# Patient Record
Sex: Female | Born: 1955 | Race: White | Hispanic: No | Marital: Married | Smoking: Never smoker
Health system: Southern US, Community
[De-identification: ages and names within clinical notes are randomized; demographics above are authoritative.]

## PROBLEM LIST (undated history)

## (undated) DIAGNOSIS — M549 Dorsalgia, unspecified: Secondary | ICD-10-CM

## (undated) DIAGNOSIS — Z87442 Personal history of urinary calculi: Secondary | ICD-10-CM

## (undated) DIAGNOSIS — I7300 Raynaud's syndrome without gangrene: Secondary | ICD-10-CM

## (undated) DIAGNOSIS — R51 Headache: Secondary | ICD-10-CM

## (undated) DIAGNOSIS — R34 Anuria and oliguria: Secondary | ICD-10-CM

## (undated) DIAGNOSIS — N319 Neuromuscular dysfunction of bladder, unspecified: Secondary | ICD-10-CM

## (undated) DIAGNOSIS — T4145XA Adverse effect of unspecified anesthetic, initial encounter: Secondary | ICD-10-CM

## (undated) DIAGNOSIS — G8929 Other chronic pain: Secondary | ICD-10-CM

## (undated) DIAGNOSIS — J189 Pneumonia, unspecified organism: Secondary | ICD-10-CM

## (undated) DIAGNOSIS — M542 Cervicalgia: Secondary | ICD-10-CM

## (undated) DIAGNOSIS — K219 Gastro-esophageal reflux disease without esophagitis: Secondary | ICD-10-CM

## (undated) DIAGNOSIS — G43909 Migraine, unspecified, not intractable, without status migrainosus: Secondary | ICD-10-CM

## (undated) DIAGNOSIS — K589 Irritable bowel syndrome without diarrhea: Secondary | ICD-10-CM

## (undated) DIAGNOSIS — T8859XA Other complications of anesthesia, initial encounter: Secondary | ICD-10-CM

## (undated) DIAGNOSIS — M5030 Other cervical disc degeneration, unspecified cervical region: Secondary | ICD-10-CM

## (undated) DIAGNOSIS — N809 Endometriosis, unspecified: Secondary | ICD-10-CM

## (undated) DIAGNOSIS — I10 Essential (primary) hypertension: Secondary | ICD-10-CM

## (undated) HISTORY — PX: HERNIA REPAIR: SHX51

## (undated) HISTORY — DX: Essential (primary) hypertension: I10

## (undated) HISTORY — DX: Endometriosis, unspecified: N80.9

## (undated) HISTORY — PX: TONSILLECTOMY: SUR1361

## (undated) HISTORY — PX: TUBAL LIGATION: SHX77

## (undated) HISTORY — PX: DILATION AND CURETTAGE OF UTERUS: SHX78

## (undated) HISTORY — PX: OTHER SURGICAL HISTORY: SHX169

---

## 2004-09-30 HISTORY — PX: NECK SURGERY: SHX720

## 2004-09-30 HISTORY — PX: BACK SURGERY: SHX140

## 2011-08-27 ENCOUNTER — Other Ambulatory Visit: Payer: Self-pay | Admitting: Neurosurgery

## 2011-08-27 DIAGNOSIS — M542 Cervicalgia: Secondary | ICD-10-CM

## 2011-09-04 ENCOUNTER — Ambulatory Visit
Admission: RE | Admit: 2011-09-04 | Discharge: 2011-09-04 | Disposition: A | Discharge status: Home / Self Care | Payer: Federal, State, Local not specified - PPO | Source: Ambulatory Visit | Attending: Neurosurgery | Admitting: Neurosurgery

## 2011-09-04 ENCOUNTER — Other Ambulatory Visit: Payer: Self-pay | Admitting: Neurosurgery

## 2011-09-04 DIAGNOSIS — M542 Cervicalgia: Secondary | ICD-10-CM

## 2011-09-13 ENCOUNTER — Encounter (HOSPITAL_COMMUNITY): Payer: Self-pay | Admitting: Pharmacy Technician

## 2011-09-19 ENCOUNTER — Encounter (HOSPITAL_COMMUNITY): Payer: Self-pay

## 2011-09-19 ENCOUNTER — Encounter (HOSPITAL_COMMUNITY)
Admission: RE | Admit: 2011-09-19 | Discharge: 2011-09-19 | Disposition: A | Discharge status: Home / Self Care | Payer: Federal, State, Local not specified - PPO | Source: Ambulatory Visit | Attending: Neurosurgery | Admitting: Neurosurgery

## 2011-09-19 HISTORY — DX: Irritable bowel syndrome without diarrhea: K58.9

## 2011-09-19 HISTORY — DX: Gastro-esophageal reflux disease without esophagitis: K21.9

## 2011-09-19 HISTORY — DX: Adverse effect of unspecified anesthetic, initial encounter: T41.45XA

## 2011-09-19 HISTORY — DX: Neuromuscular dysfunction of bladder, unspecified: N31.9

## 2011-09-19 HISTORY — DX: Headache: R51

## 2011-09-19 HISTORY — DX: Pneumonia, unspecified organism: J18.9

## 2011-09-19 HISTORY — DX: Other complications of anesthesia, initial encounter: T88.59XA

## 2011-09-19 HISTORY — DX: Anuria and oliguria: R34

## 2011-09-19 HISTORY — DX: Dorsalgia, unspecified: M54.9

## 2011-09-19 HISTORY — DX: Cervicalgia: M54.2

## 2011-09-19 HISTORY — DX: Other chronic pain: G89.29

## 2011-09-19 HISTORY — DX: Raynaud's syndrome without gangrene: I73.00

## 2011-09-19 LAB — CBC
MCH: 30.8 pg (ref 26.0–34.0)
MCHC: 34 g/dL (ref 30.0–36.0)
Platelets: 264 10*3/uL (ref 150–400)
RBC: 4.52 MIL/uL (ref 3.87–5.11)

## 2011-09-19 LAB — SURGICAL PCR SCREEN
MRSA, PCR: NEGATIVE
Staphylococcus aureus: NEGATIVE

## 2011-09-19 NOTE — Progress Notes (Signed)
Pt needs paper tape

## 2011-09-19 NOTE — Pre-Procedure Instructions (Signed)
20 Claudia Bowen  09/19/2011   Your procedure is scheduled on:  Fri,Dec 28 @ 1200  Report to [facility] at 1000 AM.  Call this number if you have problems the morning of surgery: [phone]   Remember:   Do not eat food:After Midnight.  May have clear liquids: up to 4 Hours before arrival.(until 6am)  Clear liquids include soda, tea, black coffee, apple or grape juice, broth.  Take these medicines the morning of surgery with A SIP OF WATER: Celebrex,Protonix,and Pain Pill(if needed)   Do not wear jewelry, make-up or nail polish.  Do not wear lotions, powders, or perfumes. You may wear deodorant.  Do not shave 48 hours prior to surgery.  Do not bring valuables to the hospital.  Contacts, dentures or bridgework may not be worn into surgery.  Leave suitcase in the car. After surgery it may be brought to your room.  For patients admitted to the hospital, checkout time is 11:00 AM the day of discharge.   Patients discharged the day of surgery will not be allowed to drive home.  Name and phone number of your driver:   Special Instructions: CHG Shower Use Special Wash: 1/2 bottle night before surgery and 1/2 bottle morning of surgery.   Please read over the following fact sheets that you were given: Pain Booklet, Coughing and Deep Breathing, MRSA Information and Surgical Site Infection Prevention

## 2011-09-26 MED ORDER — CEFAZOLIN SODIUM 1-5 GM-% IV SOLN
1.0000 g | INTRAVENOUS | Status: DC
  Filled 2011-09-26: qty 50

## 2011-09-27 ENCOUNTER — Ambulatory Visit (HOSPITAL_COMMUNITY): Payer: Federal, State, Local not specified - PPO

## 2011-09-27 ENCOUNTER — Ambulatory Visit (HOSPITAL_COMMUNITY): Payer: Federal, State, Local not specified - PPO | Admitting: Anesthesiology

## 2011-09-27 ENCOUNTER — Inpatient Hospital Stay (HOSPITAL_COMMUNITY)
Admission: RE | Admit: 2011-09-27 | Discharge: 2011-09-28 | DRG: 864 | Disposition: A | Discharge status: Home / Self Care | Payer: Federal, State, Local not specified - PPO | Source: Ambulatory Visit | Attending: Neurosurgery | Admitting: Neurosurgery

## 2011-09-27 ENCOUNTER — Encounter (HOSPITAL_COMMUNITY): Payer: Self-pay | Admitting: *Deleted

## 2011-09-27 ENCOUNTER — Encounter (HOSPITAL_COMMUNITY)
Admission: RE | Disposition: A | Discharge status: Home / Self Care | Payer: Self-pay | Source: Ambulatory Visit | Attending: Neurosurgery

## 2011-09-27 ENCOUNTER — Encounter (HOSPITAL_COMMUNITY): Payer: Self-pay | Admitting: Anesthesiology

## 2011-09-27 DIAGNOSIS — K219 Gastro-esophageal reflux disease without esophagitis: Secondary | ICD-10-CM | POA: Diagnosis present

## 2011-09-27 DIAGNOSIS — N319 Neuromuscular dysfunction of bladder, unspecified: Secondary | ICD-10-CM | POA: Diagnosis present

## 2011-09-27 DIAGNOSIS — M47812 Spondylosis without myelopathy or radiculopathy, cervical region: Principal | ICD-10-CM | POA: Diagnosis present

## 2011-09-27 HISTORY — PX: ANTERIOR CERVICAL DECOMP/DISCECTOMY FUSION: SHX1161

## 2011-09-27 SURGERY — ANTERIOR CERVICAL DECOMPRESSION/DISCECTOMY FUSION 3 LEVELS
Anesthesia: General | Site: Spine Cervical | Wound class: Clean

## 2011-09-27 MED ORDER — CEFAZOLIN SODIUM 1-5 GM-% IV SOLN
INTRAVENOUS | Status: DC | PRN
  Administered 2011-09-27: 1 g via INTRAVENOUS

## 2011-09-27 MED ORDER — MENTHOL 3 MG MT LOZG: 1.0000 | LOZENGE | OROMUCOSAL | Status: DC | PRN

## 2011-09-27 MED ORDER — MEPERIDINE HCL 25 MG/ML IJ SOLN: 6.2500 mg | INTRAMUSCULAR | Status: DC | PRN

## 2011-09-27 MED ORDER — SODIUM CHLORIDE 0.9 % IJ SOLN
3.0000 mL | Freq: Two times a day (BID) | INTRAMUSCULAR | Status: DC
  Administered 2011-09-27: 3 mL via INTRAVENOUS

## 2011-09-27 MED ORDER — ACETAMINOPHEN 325 MG PO TABS: 650.0000 mg | ORAL_TABLET | ORAL | Status: DC | PRN

## 2011-09-27 MED ORDER — THROMBIN 5000 UNITS EX KIT
PACK | OROMUCOSAL | Status: DC | PRN
  Administered 2011-09-27: 12:00:00 via TOPICAL

## 2011-09-27 MED ORDER — MIDAZOLAM HCL 5 MG/5ML IJ SOLN
INTRAMUSCULAR | Status: DC | PRN
  Administered 2011-09-27: 2 mg via INTRAVENOUS

## 2011-09-27 MED ORDER — HYDROMORPHONE HCL PF 1 MG/ML IJ SOLN
INTRAMUSCULAR | Status: AC
  Administered 2011-09-27: 0.25 mg via INTRAVENOUS
  Filled 2011-09-27: qty 1

## 2011-09-27 MED ORDER — GLYCOPYRROLATE 0.2 MG/ML IJ SOLN
INTRAMUSCULAR | Status: DC | PRN
  Administered 2011-09-27: .4 mg via INTRAVENOUS

## 2011-09-27 MED ORDER — PROMETHAZINE HCL 25 MG/ML IJ SOLN: 6.2500 mg | INTRAMUSCULAR | Status: DC | PRN

## 2011-09-27 MED ORDER — ONDANSETRON HCL 4 MG/2ML IJ SOLN
4.0000 mg | INTRAMUSCULAR | Status: DC | PRN
  Administered 2011-09-27 – 2011-09-28 (×3): 4 mg via INTRAVENOUS
  Filled 2011-09-27 (×3): qty 2

## 2011-09-27 MED ORDER — CELECOXIB 200 MG PO CAPS
200.0000 mg | ORAL_CAPSULE | Freq: Two times a day (BID) | ORAL | Status: DC
  Filled 2011-09-27 (×3): qty 1

## 2011-09-27 MED ORDER — PHENOL 1.4 % MT LIQD
1.0000 | OROMUCOSAL | Status: DC | PRN
  Filled 2011-09-27: qty 177

## 2011-09-27 MED ORDER — BACITRACIN 50000 UNITS IM SOLR
INTRAMUSCULAR | Status: AC
  Filled 2011-09-27: qty 50000

## 2011-09-27 MED ORDER — SUFENTANIL CITRATE 50 MCG/ML IV SOLN
INTRAVENOUS | Status: DC | PRN
  Administered 2011-09-27: 15 ug via INTRAVENOUS
  Administered 2011-09-27 (×2): 10 ug via INTRAVENOUS
  Administered 2011-09-27: 5 ug via INTRAVENOUS
  Administered 2011-09-27: 10 ug via INTRAVENOUS

## 2011-09-27 MED ORDER — PANTOPRAZOLE SODIUM 40 MG IV SOLR: 40.0000 mg | Freq: Every day | INTRAVENOUS | Status: DC

## 2011-09-27 MED ORDER — DEXAMETHASONE SODIUM PHOSPHATE 10 MG/ML IJ SOLN
INTRAMUSCULAR | Status: DC | PRN
  Administered 2011-09-27: 10 mg via INTRAVENOUS

## 2011-09-27 MED ORDER — ROCURONIUM BROMIDE 100 MG/10ML IV SOLN
INTRAVENOUS | Status: DC | PRN
  Administered 2011-09-27: 50 mg via INTRAVENOUS
  Administered 2011-09-27: 10 mg via INTRAVENOUS

## 2011-09-27 MED ORDER — ONDANSETRON HCL 4 MG/2ML IJ SOLN
INTRAMUSCULAR | Status: DC | PRN
  Administered 2011-09-27 (×2): 4 mg via INTRAVENOUS

## 2011-09-27 MED ORDER — HEMOSTATIC AGENTS (NO CHARGE) OPTIME
TOPICAL | Status: DC | PRN
  Administered 2011-09-27: 1 via TOPICAL

## 2011-09-27 MED ORDER — LACTATED RINGERS IV SOLN
INTRAVENOUS | Status: DC | PRN
  Administered 2011-09-27 (×2): via INTRAVENOUS

## 2011-09-27 MED ORDER — HYDROMORPHONE HCL PF 1 MG/ML IJ SOLN
0.2500 mg | INTRAMUSCULAR | Status: DC | PRN
  Administered 2011-09-27 (×2): 0.25 mg via INTRAVENOUS
  Administered 2011-09-27: 0.5 mg via INTRAVENOUS

## 2011-09-27 MED ORDER — HYDROMORPHONE HCL PF 1 MG/ML IJ SOLN
0.5000 mg | INTRAMUSCULAR | Status: DC | PRN
  Administered 2011-09-27: 0.25 mg via INTRAVENOUS
  Administered 2011-09-28 (×3): 0.5 mg via INTRAVENOUS
  Filled 2011-09-27 (×4): qty 1

## 2011-09-27 MED ORDER — SUFENTANIL CITRATE 50 MCG/ML IV SOLN: INTRAVENOUS | Status: DC | PRN

## 2011-09-27 MED ORDER — SODIUM CHLORIDE 0.9 % IV SOLN
INTRAVENOUS | Status: AC
  Filled 2011-09-27: qty 500

## 2011-09-27 MED ORDER — ONDANSETRON HCL 4 MG/2ML IJ SOLN: 4.0000 mg | INTRAMUSCULAR | Status: DC | PRN

## 2011-09-27 MED ORDER — ACETAMINOPHEN 650 MG RE SUPP: 650.0000 mg | RECTAL | Status: DC | PRN

## 2011-09-27 MED ORDER — SUCCINYLCHOLINE CHLORIDE 20 MG/ML IJ SOLN
INTRAMUSCULAR | Status: DC | PRN
  Administered 2011-09-27: 100 mg via INTRAVENOUS

## 2011-09-27 MED ORDER — CYCLOBENZAPRINE HCL 10 MG PO TABS: 10.0000 mg | ORAL_TABLET | Freq: Three times a day (TID) | ORAL | Status: DC | PRN

## 2011-09-27 MED ORDER — HYDROMORPHONE HCL PF 1 MG/ML IJ SOLN: 0.5000 mg | INTRAMUSCULAR | Status: DC | PRN

## 2011-09-27 MED ORDER — PROPOFOL 10 MG/ML IV EMUL
INTRAVENOUS | Status: DC | PRN
  Administered 2011-09-27: 100 mg via INTRAVENOUS

## 2011-09-27 MED ORDER — OXYCODONE-ACETAMINOPHEN 5-325 MG PO TABS: 1.0000 | ORAL_TABLET | ORAL | Status: DC | PRN

## 2011-09-27 MED ORDER — SODIUM CHLORIDE 0.9 % IR SOLN
Status: DC | PRN
  Administered 2011-09-27: 12:00:00

## 2011-09-27 MED ORDER — NEOSTIGMINE METHYLSULFATE 1 MG/ML IJ SOLN
INTRAMUSCULAR | Status: DC | PRN
  Administered 2011-09-27: 3 mg via INTRAVENOUS

## 2011-09-27 MED ORDER — CEFAZOLIN SODIUM 1-5 GM-% IV SOLN
1.0000 g | Freq: Three times a day (TID) | INTRAVENOUS | Status: AC
  Administered 2011-09-27 – 2011-09-28 (×2): 1 g via INTRAVENOUS
  Filled 2011-09-27 (×2): qty 50

## 2011-09-27 MED ORDER — DEXAMETHASONE SODIUM PHOSPHATE 10 MG/ML IJ SOLN
10.0000 mg | Freq: Once | INTRAMUSCULAR | Status: DC
  Filled 2011-09-27 (×2): qty 1

## 2011-09-27 MED ORDER — PHENOL 1.4 % MT LIQD: 1.0000 | OROMUCOSAL | Status: DC | PRN

## 2011-09-27 MED ORDER — 0.9 % SODIUM CHLORIDE (POUR BTL) OPTIME
TOPICAL | Status: DC | PRN
  Administered 2011-09-27: 1000 mL

## 2011-09-27 MED ORDER — SODIUM CHLORIDE 0.9 % IJ SOLN: 3.0000 mL | Freq: Two times a day (BID) | INTRAMUSCULAR | Status: DC

## 2011-09-27 MED ORDER — PHENYLEPHRINE HCL 10 MG/ML IJ SOLN
INTRAMUSCULAR | Status: DC | PRN
  Administered 2011-09-27 (×2): 40 ug via INTRAVENOUS
  Administered 2011-09-27: 80 ug via INTRAVENOUS
  Administered 2011-09-27: 40 ug via INTRAVENOUS
  Administered 2011-09-27: 80 ug via INTRAVENOUS
  Administered 2011-09-27 (×2): 40 ug via INTRAVENOUS
  Administered 2011-09-27: 120 ug via INTRAVENOUS
  Administered 2011-09-27: 40 ug via INTRAVENOUS
  Administered 2011-09-27 (×2): 80 ug via INTRAVENOUS

## 2011-09-27 MED ORDER — THROMBIN 5000 UNITS EX KIT
PACK | CUTANEOUS | Status: DC | PRN
  Administered 2011-09-27: 5000 [IU] via TOPICAL

## 2011-09-27 MED ORDER — PANTOPRAZOLE SODIUM 40 MG PO TBEC: 40.0000 mg | DELAYED_RELEASE_TABLET | Freq: Every day | ORAL | Status: DC

## 2011-09-27 MED ORDER — THROMBIN 20000 UNITS EX KIT
PACK | CUTANEOUS | Status: DC | PRN
  Administered 2011-09-27: 20000 [IU] via TOPICAL

## 2011-09-27 MED ORDER — PANTOPRAZOLE SODIUM 40 MG PO TBEC
40.0000 mg | DELAYED_RELEASE_TABLET | Freq: Every day | ORAL | Status: DC
  Filled 2011-09-27: qty 1

## 2011-09-27 SURGICAL SUPPLY — 64 items
BAG DECANTER FOR FLEXI CONT (MISCELLANEOUS) ×2 IMPLANT
BENZOIN TINCTURE PRP APPL 2/3 (GAUZE/BANDAGES/DRESSINGS) ×2 IMPLANT
BONE CERV LORDOTIC 14.5X12X8 (Bone Implant) ×6 IMPLANT
BONE CERV LORDOTIC 14.5X12X9 (Bone Implant) IMPLANT
BRUSH SCRUB EZ PLAIN DRY (MISCELLANEOUS) ×2 IMPLANT
BUR MATCHSTICK NEURO 3.0 LAGG (BURR) ×2 IMPLANT
CANISTER SUCTION 2500CC (MISCELLANEOUS) ×2 IMPLANT
CLOTH BEACON ORANGE TIMEOUT ST (SAFETY) ×2 IMPLANT
CONT SPEC 4OZ CLIKSEAL STRL BL (MISCELLANEOUS) ×2 IMPLANT
DECANTER SPIKE VIAL GLASS SM (MISCELLANEOUS) ×2 IMPLANT
DERMABOND ADVANCED (GAUZE/BANDAGES/DRESSINGS) ×1
DERMABOND ADVANCED .7 DNX12 (GAUZE/BANDAGES/DRESSINGS) ×1 IMPLANT
DRAIN SNY WOU 7FLT (WOUND CARE) ×2 IMPLANT
DRAPE C-ARM 42X72 X-RAY (DRAPES) ×4 IMPLANT
DRAPE LAPAROTOMY 100X72 PEDS (DRAPES) ×2 IMPLANT
DRAPE MICROSCOPE ZEISS OPMI (DRAPES) ×2 IMPLANT
DRAPE POUCH INSTRU U-SHP 10X18 (DRAPES) ×2 IMPLANT
DRILL BIT (BIT) ×2 IMPLANT
DRSG OPSITE 4X5.5 SM (GAUZE/BANDAGES/DRESSINGS) ×2 IMPLANT
ELECT COATED BLADE 2.86 ST (ELECTRODE) ×2 IMPLANT
ELECT REM PT RETURN 9FT ADLT (ELECTROSURGICAL) ×2
ELECTRODE REM PT RTRN 9FT ADLT (ELECTROSURGICAL) ×1 IMPLANT
EVACUATOR SILICONE 100CC (DRAIN) ×2 IMPLANT
GAUZE SPONGE 4X4 12PLY STRL LF (GAUZE/BANDAGES/DRESSINGS) ×2 IMPLANT
GAUZE SPONGE 4X4 16PLY XRAY LF (GAUZE/BANDAGES/DRESSINGS) ×2 IMPLANT
GLOVE BIO SURGEON STRL SZ8 (GLOVE) ×2 IMPLANT
GLOVE BIOGEL PI IND STRL 7.5 (GLOVE) ×1 IMPLANT
GLOVE BIOGEL PI INDICATOR 7.5 (GLOVE) ×1
GLOVE ECLIPSE 8.5 STRL (GLOVE) ×2 IMPLANT
GLOVE EXAM NITRILE LRG STRL (GLOVE) IMPLANT
GLOVE EXAM NITRILE MD LF STRL (GLOVE) ×4 IMPLANT
GLOVE EXAM NITRILE XL STR (GLOVE) IMPLANT
GLOVE EXAM NITRILE XS STR PU (GLOVE) IMPLANT
GLOVE INDICATOR 8.5 STRL (GLOVE) ×4 IMPLANT
GLOVE SS BIOGEL STRL SZ 7 (GLOVE) ×1 IMPLANT
GLOVE SUPERSENSE BIOGEL SZ 7 (GLOVE) ×1
GOWN BRE IMP SLV AUR LG STRL (GOWN DISPOSABLE) ×2 IMPLANT
GOWN BRE IMP SLV AUR XL STRL (GOWN DISPOSABLE) ×2 IMPLANT
GOWN STRL REIN 2XL LVL4 (GOWN DISPOSABLE) ×6 IMPLANT
HEAD HALTER (SOFTGOODS) ×2 IMPLANT
HEMOSTAT POWDER KIT SURGIFOAM (HEMOSTASIS) ×2 IMPLANT
KIT BASIN OR (CUSTOM PROCEDURE TRAY) ×2 IMPLANT
KIT ROOM TURNOVER OR (KITS) ×2 IMPLANT
NEEDLE SPNL 20GX3.5 QUINCKE YW (NEEDLE) ×2 IMPLANT
NS IRRIG 1000ML POUR BTL (IV SOLUTION) ×2 IMPLANT
PACK LAMINECTOMY NEURO (CUSTOM PROCEDURE TRAY) ×2 IMPLANT
PLATE 1 ATLANTIS TRANS (Plate) ×2 IMPLANT
PLATE 2 40XLCK NS SPNE CVD (Plate) ×1 IMPLANT
PLATE 2 ATLANTIS TRANS (Plate) ×1 IMPLANT
RUBBERBAND STERILE (MISCELLANEOUS) ×4 IMPLANT
SCREW 4.5X13MM (Screw) ×2 IMPLANT
SCREW ST FIX 4 ATL 3120213 (Screw) ×2 IMPLANT
SPONGE GAUZE 4X4 12PLY (GAUZE/BANDAGES/DRESSINGS) ×2 IMPLANT
SPONGE INTESTINAL PEANUT (DISPOSABLE) ×2 IMPLANT
SPONGE SURGIFOAM ABS GEL 100 (HEMOSTASIS) ×2 IMPLANT
STRIP CLOSURE SKIN 1/2X4 (GAUZE/BANDAGES/DRESSINGS) IMPLANT
SUT VIC AB 3-0 SH 8-18 (SUTURE) ×2 IMPLANT
SUT VICRYL 4-0 PS2 18IN ABS (SUTURE) ×2 IMPLANT
SYR 20ML ECCENTRIC (SYRINGE) ×2 IMPLANT
TAPE CLOTH 4X10 WHT NS (GAUZE/BANDAGES/DRESSINGS) IMPLANT
TOWEL OR 17X24 6PK STRL BLUE (TOWEL DISPOSABLE) ×2 IMPLANT
TOWEL OR 17X26 10 PK STRL BLUE (TOWEL DISPOSABLE) ×2 IMPLANT
TRAP SPECIMEN MUCOUS 40CC (MISCELLANEOUS) ×2 IMPLANT
WATER STERILE IRR 1000ML POUR (IV SOLUTION) ×2 IMPLANT

## 2011-09-27 NOTE — Anesthesia Procedure Notes (Signed)
Procedure Name: Intubation Date/Time: 09/27/2011 10:36 AM Performed by: Glendora Score Pre-anesthesia Checklist: Patient identified, Emergency Drugs available, Suction available and Patient being monitored Patient Re-evaluated:Patient Re-evaluated prior to inductionOxygen Delivery Method: Circle System Utilized Preoxygenation: Pre-oxygenation with 100% oxygen Intubation Type: Rapid sequence and Cricoid Pressure applied Tube size: 7.5 mm Number of attempts: 1 Airway Equipment and Method: video-laryngoscopy Placement Confirmation: ETT inserted through vocal cords under direct vision,  positive ETCO2 and breath sounds checked- equal and bilateral Secured at: 21 cm Tube secured with: Tape Dental Injury: Teeth and Oropharynx as per pre-operative assessment

## 2011-09-27 NOTE — Addendum Note (Signed)
Addendum  created 09/27/11 1458 by Melonie Florida, MD   Modules edited:Notes Section

## 2011-09-27 NOTE — Op Note (Signed)
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 Expiration of fusion removal of hardware C5-6  Surgeon: Jillyn Hidden Deunte Bledsoe  Assistant: Barnett Abu   Anesthesia: Gen.  EBL: Minimal  History of present illness:: Patient is a 55 year old female whose had progressive worsening neck and bilateral arm pain rate and 2, her left shoulder and down her left arm Failed all forms of conservative treatment. 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 He failed all forms of conservative treatment imaging findings show progressive spondylosis at C3-4 C4-5 and C6-7 spinal cord compression at all 3 levels solid bony fusion at C5-6.Marland Kitchen Very conservative treatment I mentioned progression symptoms she was recommended anterior cervical discectomies and fusion at C3-4 C4-5 and C6-7 with removal of hardware at C5-6 risks and benefits of the operation worse when the patient as well as perioperative course and expectations of outcome and alternatives of surgery. She understands and agreed to proceed forward.  Operative procedure: Patient brought into the or was induced under general anesthesia and positioned supine the neck in slight extension in 5 pounds of halter traction. The right-sided of her neck prepped and draped in routine sterile fashion. used to fashion C-arm localized the appropriate site for the incision so a curvilinear incision was made just off the midline to the intracortical the sternocleidomastoid. The superficial layer of the platysma was dissected and divided longitudinally. Avascular tenderness ecchymosis or muscle was developed down to the fascia. Fascia just with Kitners. Plate was immediately identified and sequentially removed his fusion does appear  to be solid. The longus close it root there was then reflected laterally exposing the C3-4 C4-5 and C6-7 disc space the large anterior aspect of did not Leksell rongeur all the spaces were incised with 15 blade scalpel aspect of did not first working at C3-4 and C4-5 both the spaces drilled down the posterior annulus suspect complex. At this point the operating mixture was draped and brought into the field under microscopic illumination aggressive and viable template is carried out the PLL was identified and removed in piecemeal fashion arch across laterally to the C4 pedicle pulse he for nerve roots were skeletonized flush with pedicle. There is marked ostectomy of the posterior aspect the C3 vertebral body this is all grossly under been decompress the central canal. After adequate discectomy and achieved adequate decompression both centrally and foraminally this is space was packed with Gelfoam to second C4-5. In a similar fashion C4-5 was drilled down aggressive and viable template is carried out large crossfire both C5 nerve roots were identified to C5 neuroforamina were opened up and sent and the C5 nerve roots, social pedicle. After adequate decompression achieved this level to 6 mm LifeNet wedges were inserted at both levels after adequate preparation of the endplates and copious irrigation with meticulous hemostasis. Then the transposition plate was placed all screws excellent purchase locking mechanisms were engaged and the posterior fluoroscopy confirmed good position of the plate screws and bone graft. This the retractors were repositioned the C6-7 and a similar fashion C6-7 was drilled down this this is a markedly collapsed and large anterior aspect of did not and it is residual posterior annulus suspect the come complex. Large posterior spur coming the C6 vertebral body was aggressively and bitten March across laterally C7 pedicles were identified  and the C7 nerve roots were decompressed especially on  the left skeletonized flush with pedicle the gutter for after adequate foraminotomies been achieved the endplates and scraped in another 6 graft was placed at C6-7. Then a single level plate was placed all screws excellent purchase locking mechanism was engaged and posterior fluoroscopy confirmed good position of plate screws and bone graft the. Surgeon was used to help maintain meticulous hemostasis the surgical site was inspected and after adequate hemostasis achieved a drain was placed and the wounds closed in layers with after Vicryl in the platysma and a running 4 subcuticular benzoin and Steri-Strips applied patient recovered in stable condition. At the end of case undercut sponge counts were correct.

## 2011-09-27 NOTE — Anesthesia Postprocedure Evaluation (Signed)
  Anesthesia Post-op Note  Patient: Claudia Bowen  Procedure(s) Performed:  ANTERIOR CERVICAL DECOMPRESSION/DISCECTOMY FUSION 3 LEVELS - Cervical Three-Four Cervical Four-Five, Cervical Six-Seven Anterior Cervical Decompression Fusion with exploration of fusion Cervical Five-Six- Removal of Hardware (Atlantis Plate) cadaver bone/Atlantis Translational Rm # 33 to follow  Patient Location: PACU  Anesthesia Type: General  Level of Consciousness: awake, alert  and oriented  Airway and Oxygen Therapy: Patient Spontanous Breathing  Post-op Pain: mild  Post-op Assessment: Post-op Vital signs reviewed and Patient's Cardiovascular Status Stable  Post-op Vital Signs: stable  Complications: No apparent anesthesia complications

## 2011-09-27 NOTE — H&P (Signed)
Claudia Bowen is an 55 y.o. female.   Chief Complaint: Neck and left greater right shoulder pain HPI: Patient is a very pleasant 55 year old female who has a history of previous ACDF at C5-6 presents now presents for neck pain bilateral arm pain workup revealed severe cervical stenosis above and below her old fusion at C3445 and 90. Due to patient's failed conservative treatment and progressive clinical syndrome and imaging findings patient was recommended ACDF to C3-4 C4-5 and C6-7 with removal of hardware at C5-6. She denies any difficulty ambulating denies any significant sensory loss in her hands and feet. Of note she is history of neurogenic bladder from spinal anesthesia as a child and  Past Medical History  Diagnosis Date  . Neurogenic bladder   . Complication of anesthesia     pt has a neurogenic bladder and states that she has to go home with a foley catheter  . Pneumonia     at age 52  . Headache     last migraine on 09/17/11  . Chronic back pain     degenerative disc  . Neck pain     buldging disc  . GERD (gastroesophageal reflux disease)     takes Protonix daily  . IBS (irritable bowel syndrome)   . Constipation   . Hemorrhoids   . Oligouria   . Raynaud's disease     Past Surgical History  Procedure Date  . Tonsillectomy     and adenoidectomy as a child  . Hernia repair     at age 6  . Pilondial cyst     at age 67 x 2  . Cesarean section 1987/1992  . Dilation and curettage of uterus     at age 61/24/31  . Renal stents 2005/2007  . Neck surgery 2006  . Back surgery 2006    Family History  Problem Relation Age of Onset  . Anesthesia problems Neg Hx   . Hypotension Neg Hx   . Malignant hyperthermia Neg Hx   . Pseudochol deficiency Neg Hx    Social History:  reports that she has never smoked. She does not have any smokeless tobacco history on file. She reports that she does not drink alcohol or use illicit drugs.  Allergies:  Allergies  Allergen  Reactions  . Sulfa Antibiotics Anaphylaxis and Hives  . Levaquin Hives  . Lortab Itching    Medications Prior to Admission  Medication Dose Route Frequency Provider Last Rate Last Dose  . bacitracin 16109 UNITS injection           . ceFAZolin (ANCEF) IVPB 1 g/50 mL premix  1 g Intravenous 60 min Pre-Op Mariam Dollar      . dexamethasone (DECADRON) injection 10 mg  10 mg Intravenous Once Mariam Dollar      . sodium chloride 0.9 % infusion            No current outpatient prescriptions on file as of 09/27/2011.    No results found for this or any previous visit (from the past 48 hour(s)). No results found.  Review of Systems  Constitutional: Negative.   HENT: Positive for neck pain.   Eyes: Negative.   Respiratory: Negative.   Cardiovascular: Negative.   Gastrointestinal: Negative.   Skin: Negative.   Neurological: Positive for headaches.    Blood pressure 153/88, pulse 91, temperature 97.6 F (36.4 C), temperature source Oral, resp. rate 18, SpO2 100.00%. Physical Exam  Constitutional: She is oriented to person, place,  and time. She appears well-developed.  HENT:  Head: Normocephalic.  Eyes: Pupils are equal, round, and reactive to light.  Respiratory: Breath sounds normal.  GI: Bowel sounds are normal.  Musculoskeletal: Normal range of motion.  Neurological: She is alert and oriented to person, place, and time. She has normal strength. GCS eye subscore is 4. GCS verbal subscore is 5. GCS motor subscore is 6.  Reflex Scores:      Tricep reflexes are 1+ on the right side and 1+ on the left side.      Bicep reflexes are 1+ on the right side and 1+ on the left side.      Brachioradialis reflexes are 1+ on the right side and 1+ on the left side.      Patellar reflexes are 1+ on the right side and 1+ on the left side.      Achilles reflexes are 0 on the right side and 0 on the left side.      Strength is 4+ out of 5 in both triceps otherwise 5 out of 5 in her upper gym is lower  extremity     Assessment/Plan 55 year old female presents for a 3 level ACDF at C3-4 C4-5 and C6-7 with removal of hardware at C5-6. We'll extensively reviewed the risks and benefits of surgery, expectations of surgery, alternatives to surgery, and perioperative course she understands and agrees to proceed forward.  Jahzion Brogden P 09/27/2011, 10:19 AM

## 2011-09-27 NOTE — Anesthesia Preprocedure Evaluation (Signed)
Anesthesia Evaluation  Patient identified by MRN, date of birth, ID band Patient awake    Reviewed: Allergy & Precautions, H&P , NPO status , Patient's Chart, lab work & pertinent test results  Airway Mallampati: III TM Distance: >3 FB Neck ROM: Limited  Mouth opening: Limited Mouth Opening  Dental No notable dental hx. (+) Teeth Intact   Pulmonary neg pulmonary ROS,  clear to auscultation  Pulmonary exam normal       Cardiovascular neg cardio ROS Regular Normal    Neuro/Psych  Headaches, Negative Psych ROS   GI/Hepatic Neg liver ROS, GERD-  Poorly Controlled and Medicated,  Endo/Other  Negative Endocrine ROS  Renal/GU negative Renal ROS  Genitourinary negative   Musculoskeletal   Abdominal   Peds  Hematology negative hematology ROS (+)   Anesthesia Other Findings   Reproductive/Obstetrics negative OB ROS                           Anesthesia Physical Anesthesia Plan  ASA: II  Anesthesia Plan: General, Cricoid Pressure and Rapid Sequence   Post-op Pain Management:    Induction: Intravenous  Airway Management Planned: Oral ETT  Additional Equipment:   Intra-op Plan:   Post-operative Plan: Extubation in OR  Informed Consent: I have reviewed the patients History and Physical, chart, labs and discussed the procedure including the risks, benefits and alternatives for the proposed anesthesia with the patient or authorized representative who has indicated his/her understanding and acceptance.     Plan Discussed with: CRNA  Anesthesia Plan Comments:         Anesthesia Quick Evaluation

## 2011-09-27 NOTE — Transfer of Care (Signed)
Immediate Anesthesia Transfer of Care Note  Patient: Claudia Bowen  Procedure(s) Performed:  ANTERIOR CERVICAL DECOMPRESSION/DISCECTOMY FUSION 3 LEVELS - Cervical Three-Four Cervical Four-Five, Cervical Six-Seven Anterior Cervical Decompression Fusion with exploration of fusion Cervical Five-Six- Removal of Hardware (Atlantis Plate) cadaver bone/Atlantis Translational Rm # 33 to follow  Patient Location: PACU  Anesthesia Type: General  Level of Consciousness: awake, sedated and patient cooperative  Airway & Oxygen Therapy: Patient Spontanous Breathing and Patient connected to face mask oxygen  Post-op Assessment: Report given to PACU RN  Post vital signs: Reviewed and stable  Complications: No apparent anesthesia complications

## 2011-09-27 NOTE — Anesthesia Postprocedure Evaluation (Signed)
  Anesthesia Post-op Note  Patient: Claudia Bowen  Procedure(s) Performed:  ANTERIOR CERVICAL DECOMPRESSION/DISCECTOMY FUSION 3 LEVELS - Cervical Three-Four Cervical Four-Five, Cervical Six-Seven Anterior Cervical Decompression Fusion with exploration of fusion Cervical Five-Six- Removal of Hardware (Atlantis Plate) cadaver bone/Atlantis Translational Rm # 33 to follow  Patient Location: PACU  Anesthesia Type: General  Level of Consciousness: sedated  Airway and Oxygen Therapy: Patient Spontanous Breathing and Patient connected to face mask oxygen  Post-op Pain: none  Post-op Assessment: Post-op Vital signs reviewed, Patient's Cardiovascular Status Stable, Respiratory Function Stable and Patent Airway  Post-op Vital Signs: Reviewed and stable  Complications: No apparent anesthesia complications

## 2011-09-27 NOTE — Preoperative (Signed)
Beta Blockers   Reason not to administer Beta Blockers:Not Applicable 

## 2011-09-28 MED ORDER — CYCLOBENZAPRINE HCL 10 MG PO TABS: 10.0000 mg | ORAL_TABLET | Freq: Three times a day (TID) | ORAL | Status: AC | PRN

## 2011-09-28 MED ORDER — ONDANSETRON HCL 4 MG PO TABS: 4.0000 mg | ORAL_TABLET | Freq: Three times a day (TID) | ORAL | Status: AC | PRN

## 2011-09-28 NOTE — Progress Notes (Signed)
Subjective: Patient reports Patient is doing very well significant room in her preoperative arm symptoms allow swallowing difficulty a lot of pain in the back of her neck and some nausea but otherwise doing fairly well the  Objective: Vital signs in last 24 hours: Temp:  [97.6 F (36.4 C)-98.2 F (36.8 C)] 98.2 F (36.8 C) (12/29 0650) Pulse Rate:  [80-98] 80  (12/29 0650) Resp:  [12-18] 18  (12/29 0650) BP: (110-153)/(71-88) 110/71 mmHg (12/29 0650) SpO2:  [94 %-100 %] 97 % (12/29 0650) Weight:  [55.3 kg (121 lb 14.6 oz)] 121 lb 14.6 oz (55.3 kg) (12/28 1700)  Intake/Output from previous day: 12/28 0701 - 12/29 0700 In: 2607 [I.V.:2503; IV Piggyback:104] Out: 1432 [Urine:1350; Drains:82] Intake/Output this shift:    Strength is baseline for the placenta 5 in her triceps wound is clean and dry  Lab Results: No results found for this basename: WBC:2,HGB:2,HCT:2,PLT:2 in the last 72 hours BMET No results found for this basename: NA:2,K:2,CL:2,CO2:2,GLUCOSE:2,BUN:2,CREATININE:2,CALCIUM:2 in the last 72 hours  Studies/Results: Dg Cervical Spine 1 View  09/27/2011  *RADIOLOGY REPORT*  Clinical Data: C3-C4, C4-C5 and C6-C7 ACDF.  Plate removal at C5- C6.  DG SPINE PORTABLE - 1 VIEW  Comparison: Cervical MRI and radiographs 09/04/2011.  Findings: Single spot lateral fluoroscopic image of the cervical spine demonstrates interval removal of the C5-C6 anterior plate and screws.  There is a new anterior plate and screws extending from C3- C5 with associated intervertebral bone plugs.  There is a new anterior plate and screws at C6-C7 as well.  The new hardware appears well positioned.  The alignment is normal.  Surgical sponges are present anteriorly within the surgical bed.  IMPRESSION: Intraoperative views during revision of cervical fusion, now extending from C3-C7.  No demonstrated complication.  Original Report Authenticated By: Gerrianne Scale, M.D.    Assessment/Plan: 55 year female  status post 3 level into cervical discectomy and fusion doing fairly well be discharged home with her bladder history will discharge her with the catheter and have her followup with urology for removal in the weeks' time.  LOS: 1 day     Kay Ricciuti P 09/28/2011, 8:26 AM

## 2011-09-28 NOTE — Discharge Summary (Signed)
  Physician Discharge Summary  Patient ID: Claudia Bowen MRN: [id number] DOB/AGE: 56-Nov-1957 56 y.o.  Admit date: 09/27/2011 Discharge date: 09/28/2011  Admission Diagnoses: Cervical spondylosis with stenosis at C3-4 4567  Discharge Diagnoses: Same Active Problems:  * No active hospital problems. 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  Consults: Significant Diagnostic Studies: Treatments: Discharge Exam: Blood pressure 110/71, pulse 80, temperature 98.2 F (36.8 C), temperature source Oral, resp. rate 18, height 5\' 4"  (1.626 m), weight 55.3 kg (121 lb 14.6 oz), SpO2 97.00%. Strength is baseline for placenta 5 bilateral triceps when clean dry.her drain  Disposition: Home   Current Discharge Medication List    START taking these medications   Details  cyclobenzaprine (FLEXERIL) 10 MG tablet Take 1 tablet (10 mg total) by mouth 3 (three) times daily as needed for muscle spasms. Qty: 80 tablet, Refills: 1    ondansetron (ZOFRAN) 4 MG tablet Take 1 tablet (4 mg total) by mouth every 8 (eight) hours as needed for nausea. Qty: 60 tablet, Refills: 1      CONTINUE these medications which have NOT CHANGED   Details  celecoxib (CELEBREX) 200 MG capsule Take 200 mg by mouth 2 (two) times daily.      oxyCODONE-acetaminophen (PERCOCET) 5-325 MG per tablet Take 1 tablet by mouth every 4 (four) hours as needed. For pain     pantoprazole (PROTONIX) 40 MG tablet Take 40 mg by mouth daily.         Follow-up Information    Follow up in 1 week.         Signed: Chesnee Floren P 09/28/2011, 8:30 AM

## 2011-10-02 ENCOUNTER — Encounter (HOSPITAL_COMMUNITY): Payer: Self-pay | Admitting: Neurosurgery

## 2011-10-24 ENCOUNTER — Other Ambulatory Visit: Payer: Self-pay | Admitting: Neurosurgery

## 2011-10-24 ENCOUNTER — Ambulatory Visit
Admission: RE | Admit: 2011-10-24 | Discharge: 2011-10-24 | Disposition: A | Discharge status: Home / Self Care | Payer: Federal, State, Local not specified - PPO | Source: Ambulatory Visit | Attending: Neurosurgery | Admitting: Neurosurgery

## 2011-10-24 DIAGNOSIS — M542 Cervicalgia: Secondary | ICD-10-CM

## 2011-11-21 ENCOUNTER — Ambulatory Visit
Admission: RE | Admit: 2011-11-21 | Discharge: 2011-11-21 | Disposition: A | Discharge status: Home / Self Care | Payer: Federal, State, Local not specified - PPO | Source: Ambulatory Visit | Attending: Neurosurgery | Admitting: Neurosurgery

## 2011-11-21 ENCOUNTER — Other Ambulatory Visit: Payer: Self-pay | Admitting: Neurosurgery

## 2011-11-21 DIAGNOSIS — M25512 Pain in left shoulder: Secondary | ICD-10-CM

## 2011-11-21 DIAGNOSIS — M542 Cervicalgia: Secondary | ICD-10-CM

## 2011-11-25 ENCOUNTER — Ambulatory Visit
Admission: RE | Admit: 2011-11-25 | Discharge: 2011-11-25 | Disposition: A | Discharge status: Home / Self Care | Payer: Federal, State, Local not specified - PPO | Source: Ambulatory Visit | Attending: Neurosurgery | Admitting: Neurosurgery

## 2011-11-25 DIAGNOSIS — M25512 Pain in left shoulder: Secondary | ICD-10-CM

## 2013-08-09 ENCOUNTER — Other Ambulatory Visit: Payer: Self-pay | Admitting: Psychiatry

## 2013-08-09 DIAGNOSIS — G43909 Migraine, unspecified, not intractable, without status migrainosus: Secondary | ICD-10-CM

## 2013-08-09 DIAGNOSIS — M4802 Spinal stenosis, cervical region: Secondary | ICD-10-CM

## 2013-08-19 ENCOUNTER — Other Ambulatory Visit: Payer: Federal, State, Local not specified - PPO

## 2013-08-21 ENCOUNTER — Ambulatory Visit
Admission: RE | Admit: 2013-08-21 | Discharge: 2013-08-21 | Disposition: A | Discharge status: Home / Self Care | Payer: Federal, State, Local not specified - PPO | Source: Ambulatory Visit | Attending: Psychiatry | Admitting: Psychiatry

## 2013-08-21 DIAGNOSIS — G43909 Migraine, unspecified, not intractable, without status migrainosus: Secondary | ICD-10-CM

## 2013-08-21 DIAGNOSIS — M4802 Spinal stenosis, cervical region: Secondary | ICD-10-CM

## 2013-09-01 ENCOUNTER — Ambulatory Visit
Admission: RE | Admit: 2013-09-01 | Discharge: 2013-09-01 | Disposition: A | Discharge status: Home / Self Care | Payer: Federal, State, Local not specified - PPO | Source: Ambulatory Visit | Attending: Psychiatry | Admitting: Psychiatry

## 2013-11-04 ENCOUNTER — Other Ambulatory Visit: Payer: Self-pay | Admitting: Neurosurgery

## 2013-11-04 DIAGNOSIS — M545 Low back pain, unspecified: Secondary | ICD-10-CM

## 2013-11-25 ENCOUNTER — Other Ambulatory Visit: Payer: Federal, State, Local not specified - PPO

## 2013-12-03 ENCOUNTER — Other Ambulatory Visit: Payer: Federal, State, Local not specified - PPO

## 2016-02-13 ENCOUNTER — Other Ambulatory Visit: Payer: Self-pay | Admitting: Neurosurgery

## 2016-02-13 DIAGNOSIS — M5126 Other intervertebral disc displacement, lumbar region: Secondary | ICD-10-CM

## 2016-03-06 ENCOUNTER — Ambulatory Visit
Admission: RE | Admit: 2016-03-06 | Discharge: 2016-03-06 | Disposition: A | Discharge status: Home / Self Care | Payer: Federal, State, Local not specified - PPO | Source: Ambulatory Visit | Attending: Neurosurgery | Admitting: Neurosurgery

## 2016-03-06 DIAGNOSIS — M5126 Other intervertebral disc displacement, lumbar region: Secondary | ICD-10-CM

## 2016-03-06 MED ORDER — GADOBENATE DIMEGLUMINE 529 MG/ML IV SOLN
11.0000 mL | Freq: Once | INTRAVENOUS | Status: AC | PRN
  Administered 2016-03-06: 11 mL via INTRAVENOUS

## 2016-09-19 DIAGNOSIS — M5416 Radiculopathy, lumbar region: Secondary | ICD-10-CM | POA: Diagnosis not present

## 2016-09-19 DIAGNOSIS — M5126 Other intervertebral disc displacement, lumbar region: Secondary | ICD-10-CM | POA: Diagnosis not present

## 2016-11-06 DIAGNOSIS — R251 Tremor, unspecified: Secondary | ICD-10-CM | POA: Diagnosis not present

## 2016-11-06 DIAGNOSIS — G43519 Persistent migraine aura without cerebral infarction, intractable, without status migrainosus: Secondary | ICD-10-CM | POA: Diagnosis not present

## 2016-11-06 DIAGNOSIS — M62838 Other muscle spasm: Secondary | ICD-10-CM | POA: Diagnosis not present

## 2016-11-06 DIAGNOSIS — Z6821 Body mass index (BMI) 21.0-21.9, adult: Secondary | ICD-10-CM | POA: Diagnosis not present

## 2016-11-14 DIAGNOSIS — Z1159 Encounter for screening for other viral diseases: Secondary | ICD-10-CM | POA: Diagnosis not present

## 2016-11-14 DIAGNOSIS — K219 Gastro-esophageal reflux disease without esophagitis: Secondary | ICD-10-CM | POA: Diagnosis not present

## 2016-11-14 DIAGNOSIS — Z0000 Encounter for general adult medical examination without abnormal findings: Secondary | ICD-10-CM | POA: Diagnosis not present

## 2016-11-14 DIAGNOSIS — Z6821 Body mass index (BMI) 21.0-21.9, adult: Secondary | ICD-10-CM | POA: Diagnosis not present

## 2016-12-23 DIAGNOSIS — Z124 Encounter for screening for malignant neoplasm of cervix: Secondary | ICD-10-CM | POA: Diagnosis not present

## 2016-12-23 DIAGNOSIS — N941 Unspecified dyspareunia: Secondary | ICD-10-CM | POA: Diagnosis not present

## 2016-12-23 DIAGNOSIS — Z01419 Encounter for gynecological examination (general) (routine) without abnormal findings: Secondary | ICD-10-CM | POA: Diagnosis not present

## 2016-12-23 DIAGNOSIS — Z6821 Body mass index (BMI) 21.0-21.9, adult: Secondary | ICD-10-CM | POA: Diagnosis not present

## 2017-01-15 DIAGNOSIS — Z1231 Encounter for screening mammogram for malignant neoplasm of breast: Secondary | ICD-10-CM | POA: Diagnosis not present

## 2017-01-30 ENCOUNTER — Emergency Department (HOSPITAL_COMMUNITY): Payer: PRIVATE HEALTH INSURANCE

## 2017-01-30 ENCOUNTER — Encounter (HOSPITAL_COMMUNITY): Payer: Self-pay | Admitting: *Deleted

## 2017-01-30 DIAGNOSIS — M79632 Pain in left forearm: Secondary | ICD-10-CM | POA: Diagnosis not present

## 2017-01-30 DIAGNOSIS — S40022A Contusion of left upper arm, initial encounter: Secondary | ICD-10-CM | POA: Diagnosis not present

## 2017-01-30 DIAGNOSIS — Y999 Unspecified external cause status: Secondary | ICD-10-CM | POA: Diagnosis not present

## 2017-01-30 DIAGNOSIS — W1830XA Fall on same level, unspecified, initial encounter: Secondary | ICD-10-CM | POA: Diagnosis not present

## 2017-01-30 DIAGNOSIS — Y939 Activity, unspecified: Secondary | ICD-10-CM | POA: Insufficient documentation

## 2017-01-30 DIAGNOSIS — S4992XA Unspecified injury of left shoulder and upper arm, initial encounter: Secondary | ICD-10-CM | POA: Diagnosis present

## 2017-01-30 DIAGNOSIS — R0789 Other chest pain: Secondary | ICD-10-CM | POA: Diagnosis not present

## 2017-01-30 DIAGNOSIS — R079 Chest pain, unspecified: Secondary | ICD-10-CM | POA: Diagnosis not present

## 2017-01-30 DIAGNOSIS — S5012XA Contusion of left forearm, initial encounter: Secondary | ICD-10-CM | POA: Diagnosis not present

## 2017-01-30 DIAGNOSIS — Z79899 Other long term (current) drug therapy: Secondary | ICD-10-CM | POA: Insufficient documentation

## 2017-01-30 DIAGNOSIS — Y929 Unspecified place or not applicable: Secondary | ICD-10-CM | POA: Diagnosis not present

## 2017-01-30 DIAGNOSIS — S59912A Unspecified injury of left forearm, initial encounter: Secondary | ICD-10-CM | POA: Diagnosis not present

## 2017-01-30 NOTE — ED Triage Notes (Signed)
Pt states yesterday she was helping a patient to the floor and the patient pushed her left arm into the door jam. She assisted him up using her left arm. She felt a soreness in her back and left arm last night. This morning when she woke up she feels a pain in the left chest and it hurts to take a deep breath. She zanaflex 0.5mg  about 1.5 hours ago.

## 2017-01-30 NOTE — ED Triage Notes (Signed)
Pt has tenderness to the left chest with palpation, pain with inspiration, and mild swelling to the forearm. Lungs clear, no deformity

## 2017-01-31 ENCOUNTER — Emergency Department (HOSPITAL_COMMUNITY)
Admission: EM | Admit: 2017-01-31 | Discharge: 2017-01-31 | Disposition: A | Discharge status: Home / Self Care | Payer: PRIVATE HEALTH INSURANCE | Attending: Emergency Medicine | Admitting: Emergency Medicine

## 2017-01-31 DIAGNOSIS — S40022A Contusion of left upper arm, initial encounter: Secondary | ICD-10-CM

## 2017-01-31 DIAGNOSIS — R0789 Other chest pain: Secondary | ICD-10-CM

## 2017-01-31 HISTORY — DX: Migraine, unspecified, not intractable, without status migrainosus: G43.909

## 2017-01-31 NOTE — ED Provider Notes (Signed)
[facility] DEPT Provider Note   CSN: [id number] Arrival date & time: 01/30/17  2310     History   Chief Complaint Chief Complaint  Patient presents with  . Arm Pain    HPI Claudia Bowen is a 61 y.o. female.  Patient is a 61 year old female with history of degenerative disc disease. She presents today for evaluation of left arm pain and left sided chest wall pain. This started yesterday. She is a nurse here at the hospital. She tells me she was ambulating a patient yesterday who became dizzy and fell into her and tender into a doorway. She has swelling to the left mid forearm and is now having pain to her left chest wall. She denies any difficulty breathing. She denies any nausea or diaphoresis. She denies any leg swelling or calf pain. She has taken tizanidine with some relief.      Past Medical History:  Diagnosis Date  . Chronic back pain    degenerative disc  . Complication of anesthesia    pt has a neurogenic bladder and states that she has to go home with a foley catheter  . Constipation   . GERD (gastroesophageal reflux disease)    takes Protonix daily  . Headache(784.0)    last migraine on 09/17/11  . Hemorrhoids   . IBS (irritable bowel syndrome)   . Migraine   . Neck pain    buldging disc  . Neurogenic bladder   . Oligouria   . Pneumonia    at age 4  . Raynaud's disease     There are no active problems to display for this patient.   Past Surgical History:  Procedure Laterality Date  . ANTERIOR CERVICAL DECOMP/DISCECTOMY FUSION  09/27/2011   Procedure: ANTERIOR CERVICAL DECOMPRESSION/DISCECTOMY FUSION 3 LEVELS;  Surgeon: Mariam Dollar;  Location: [facility];  Service: Neurosurgery;  Laterality: N/A;  Cervical Three-Four Cervical Four-Five, Cervical Six-Seven Anterior Cervical Decompression Fusion with exploration of fusion Cervical Five-Six- Removal of Hardware (Atlantis Plate) cadaver bone/Atlantis Translational Rm # 33 to follow  . BACK  SURGERY  2006  . CESAREAN SECTION  1987/1992  . DILATION AND CURETTAGE OF UTERUS     at age 69/24/31  . HERNIA REPAIR     at age 57  . NECK SURGERY  2006  . pilondial cyst     at age 7 x 2  . renal stents  2005/2007  . TONSILLECTOMY     and adenoidectomy as a child    OB History    No data available       Home Medications    Prior to Admission medications   Medication Sig Start Date End Date Taking? Authorizing Provider  celecoxib (CELEBREX) 200 MG capsule Take 200 mg by mouth 2 (two) times daily.      Historical Provider, MD  oxyCODONE-acetaminophen (PERCOCET) 5-325 MG per tablet Take 1 tablet by mouth every 4 (four) hours as needed. For pain     Historical Provider, MD  pantoprazole (PROTONIX) 40 MG tablet Take 40 mg by mouth daily.      Historical Provider, MD    Family History Family History  Problem Relation Age of Onset  . Anesthesia problems Neg Hx   . Hypotension Neg Hx   . Malignant hyperthermia Neg Hx   . Pseudochol deficiency Neg Hx     Social History Social History  Substance Use Topics  . Smoking status: Never Smoker  . Smokeless tobacco: Never Used  .  Alcohol use No     Allergies   Sulfa antibiotics; Hydrocodone-acetaminophen; and Levofloxacin   Review of Systems Review of Systems  All other systems reviewed and are negative.    Physical Exam Updated Vital Signs BP (!) 147/90 (BP Location: Right Arm)   Pulse 88   Temp 97.6 F (36.4 C) (Oral)   Resp 16   Ht 5\' 4"  (1.626 m)   Wt 128 lb (58.1 kg)   SpO2 98%   BMI 21.97 kg/m   Physical Exam  Constitutional: She is oriented to person, place, and time. She appears well-developed and well-nourished. No distress.  HENT:  Head: Normocephalic and atraumatic.  Neck: Normal range of motion. Neck supple.  Cardiovascular: Normal rate and regular rhythm.  Exam reveals no gallop and no friction rub.   No murmur heard. Pulmonary/Chest: Effort normal and breath sounds normal. No respiratory  distress. She has no wheezes. She exhibits tenderness.  There is tenderness to palpation of the left lateral chest wall. There is no crepitus or palpable abnormality.  Abdominal: Soft. Bowel sounds are normal. She exhibits no distension. There is no tenderness.  Musculoskeletal: Normal range of motion.  There is mild swelling and ecchymosis to the mid left lateral forearm. There is no obvious deformity otherwise. Distal PMS is intact and she has full range of motion of the elbow and wrist.  Neurological: She is alert and oriented to person, place, and time.  Skin: Skin is warm and dry. She is not diaphoretic.  Nursing note and vitals reviewed.    ED Treatments / Results  Labs (all labs ordered are listed, but only abnormal results are displayed) Labs Reviewed - No data to display  EKG  EKG Interpretation  Date/Time:  Thursday Jan 30 2017 23:27:46 EDT Ventricular Rate:  88 PR Interval:  142 QRS Duration: 70 QT Interval:  350 QTC Calculation: 423 R Axis:   69 Text Interpretation:  Normal sinus rhythm Normal ECG Confirmed by Ashleigh Arya  MD, Kelty Szafran ([id number]) on 01/31/2017 1:10:35 AM       Radiology Dg Chest 2 View  Result Date: 01/31/2017 CLINICAL DATA:  Left-sided chest pain with dyspnea and palpitations, onset tonight. EXAM: CHEST  2 VIEW COMPARISON:  None. FINDINGS: The lungs are clear. The pulmonary vasculature is normal. Heart size is normal. Hilar and mediastinal contours are unremarkable. There is no pleural effusion. IMPRESSION: No active cardiopulmonary disease. Electronically Signed   By: Ellery Plunkaniel R Mitchell M.D.   On: 01/31/2017 00:09   Dg Forearm Left  Result Date: 01/31/2017 CLINICAL DATA:  Hit forearm against a door, pain EXAM: LEFT FOREARM - 2 VIEW COMPARISON:  None. FINDINGS: There is no evidence of fracture or other focal bone lesions. Soft tissues are unremarkable. IMPRESSION: Negative. Electronically Signed   By: Jasmine PangKim  Fujinaga M.D.   On: 01/31/2017 00:10     Procedures Procedures (including critical care time)  Medications Ordered in ED Medications - No data to display   Initial Impression / Assessment and Plan / ED Course  I have reviewed the triage vital signs and the nursing notes.  Pertinent labs & imaging results that were available during my care of the patient were reviewed by me and considered in my medical decision making (see chart for details).  Patient with a forearm contusion and negative x-rays. She is also having pain in her left lateral chest wall that is very reproducible with a normal EKG and negative chest x-ray. There is no hypoxia and no tachycardia, and I  highly doubt the possibility of PE.  She will be discharged with an arm sling and continued muscle relaxers. To return as needed for any problems.  Final Clinical Impressions(s) / ED Diagnoses   Final diagnoses:  None    New Prescriptions New Prescriptions   No medications on file     Geoffery Lyons, MD 01/31/17 [phone]

## 2017-01-31 NOTE — Discharge Instructions (Signed)
Continue taking your tizanidine as previously prescribed.  Wear arm sling as applied in the Emergency Department as needed for comfort.

## 2017-04-01 DIAGNOSIS — Z6822 Body mass index (BMI) 22.0-22.9, adult: Secondary | ICD-10-CM | POA: Diagnosis not present

## 2017-04-01 DIAGNOSIS — R251 Tremor, unspecified: Secondary | ICD-10-CM | POA: Diagnosis not present

## 2017-04-01 DIAGNOSIS — M542 Cervicalgia: Secondary | ICD-10-CM | POA: Diagnosis not present

## 2017-04-01 DIAGNOSIS — G43519 Persistent migraine aura without cerebral infarction, intractable, without status migrainosus: Secondary | ICD-10-CM | POA: Diagnosis not present

## 2017-05-13 DIAGNOSIS — Z6822 Body mass index (BMI) 22.0-22.9, adult: Secondary | ICD-10-CM | POA: Diagnosis not present

## 2017-05-13 DIAGNOSIS — M5416 Radiculopathy, lumbar region: Secondary | ICD-10-CM | POA: Diagnosis not present

## 2017-05-13 DIAGNOSIS — M5441 Lumbago with sciatica, right side: Secondary | ICD-10-CM | POA: Diagnosis not present

## 2017-06-30 DIAGNOSIS — R030 Elevated blood-pressure reading, without diagnosis of hypertension: Secondary | ICD-10-CM | POA: Diagnosis not present

## 2017-06-30 DIAGNOSIS — M5416 Radiculopathy, lumbar region: Secondary | ICD-10-CM | POA: Diagnosis not present

## 2017-07-08 ENCOUNTER — Other Ambulatory Visit: Payer: Self-pay | Admitting: Student

## 2017-07-08 DIAGNOSIS — M5416 Radiculopathy, lumbar region: Secondary | ICD-10-CM

## 2017-07-19 ENCOUNTER — Other Ambulatory Visit: Payer: Self-pay

## 2017-07-20 ENCOUNTER — Ambulatory Visit
Admission: RE | Admit: 2017-07-20 | Discharge: 2017-07-20 | Disposition: A | Discharge status: Home / Self Care | Payer: Federal, State, Local not specified - PPO | Source: Ambulatory Visit | Attending: Student | Admitting: Student

## 2017-07-20 DIAGNOSIS — M4807 Spinal stenosis, lumbosacral region: Secondary | ICD-10-CM | POA: Diagnosis not present

## 2017-07-20 DIAGNOSIS — M5416 Radiculopathy, lumbar region: Secondary | ICD-10-CM

## 2017-07-20 MED ORDER — GADOBENATE DIMEGLUMINE 529 MG/ML IV SOLN
10.0000 mL | Freq: Once | INTRAVENOUS | Status: AC | PRN
  Administered 2017-07-20: 10 mL via INTRAVENOUS

## 2017-07-21 DIAGNOSIS — N281 Cyst of kidney, acquired: Secondary | ICD-10-CM | POA: Diagnosis not present

## 2017-07-21 DIAGNOSIS — R030 Elevated blood-pressure reading, without diagnosis of hypertension: Secondary | ICD-10-CM | POA: Diagnosis not present

## 2017-07-21 DIAGNOSIS — M4807 Spinal stenosis, lumbosacral region: Secondary | ICD-10-CM | POA: Diagnosis not present

## 2017-07-21 DIAGNOSIS — M5416 Radiculopathy, lumbar region: Secondary | ICD-10-CM | POA: Diagnosis not present

## 2017-07-22 ENCOUNTER — Other Ambulatory Visit: Payer: Self-pay | Admitting: Neurosurgery

## 2017-07-22 DIAGNOSIS — N281 Cyst of kidney, acquired: Secondary | ICD-10-CM

## 2017-07-31 DIAGNOSIS — M48061 Spinal stenosis, lumbar region without neurogenic claudication: Secondary | ICD-10-CM | POA: Diagnosis not present

## 2017-08-04 DIAGNOSIS — M48061 Spinal stenosis, lumbar region without neurogenic claudication: Secondary | ICD-10-CM | POA: Diagnosis not present

## 2017-08-07 DIAGNOSIS — M6281 Muscle weakness (generalized): Secondary | ICD-10-CM | POA: Diagnosis not present

## 2017-08-07 DIAGNOSIS — M48061 Spinal stenosis, lumbar region without neurogenic claudication: Secondary | ICD-10-CM | POA: Diagnosis not present

## 2017-08-11 DIAGNOSIS — M48061 Spinal stenosis, lumbar region without neurogenic claudication: Secondary | ICD-10-CM | POA: Diagnosis not present

## 2017-08-11 DIAGNOSIS — M6281 Muscle weakness (generalized): Secondary | ICD-10-CM | POA: Diagnosis not present

## 2017-08-22 DIAGNOSIS — M48061 Spinal stenosis, lumbar region without neurogenic claudication: Secondary | ICD-10-CM | POA: Diagnosis not present

## 2017-08-22 DIAGNOSIS — M6281 Muscle weakness (generalized): Secondary | ICD-10-CM | POA: Diagnosis not present

## 2017-08-26 ENCOUNTER — Other Ambulatory Visit: Payer: Self-pay | Admitting: Neurosurgery

## 2017-08-26 DIAGNOSIS — N281 Cyst of kidney, acquired: Secondary | ICD-10-CM | POA: Diagnosis not present

## 2017-08-26 DIAGNOSIS — M4807 Spinal stenosis, lumbosacral region: Secondary | ICD-10-CM | POA: Diagnosis not present

## 2017-08-26 DIAGNOSIS — R030 Elevated blood-pressure reading, without diagnosis of hypertension: Secondary | ICD-10-CM | POA: Diagnosis not present

## 2017-08-28 ENCOUNTER — Other Ambulatory Visit: Payer: Self-pay | Admitting: Neurosurgery

## 2017-08-28 DIAGNOSIS — N281 Cyst of kidney, acquired: Secondary | ICD-10-CM

## 2017-08-29 ENCOUNTER — Ambulatory Visit
Admission: RE | Admit: 2017-08-29 | Discharge: 2017-08-29 | Disposition: A | Discharge status: Home / Self Care | Payer: Federal, State, Local not specified - PPO | Source: Ambulatory Visit | Attending: Neurosurgery | Admitting: Neurosurgery

## 2017-08-29 DIAGNOSIS — K7689 Other specified diseases of liver: Secondary | ICD-10-CM | POA: Diagnosis not present

## 2017-08-29 DIAGNOSIS — N281 Cyst of kidney, acquired: Secondary | ICD-10-CM

## 2017-08-29 MED ORDER — IOPAMIDOL (ISOVUE-300) INJECTION 61%
100.0000 mL | Freq: Once | INTRAVENOUS | Status: AC | PRN
  Administered 2017-08-29: 100 mL via INTRAVENOUS

## 2017-09-17 NOTE — Pre-Procedure Instructions (Signed)
Claudia Bowen  09/17/2017      [id number] - [place], [place] - [name] [facility] [id number] Phone: [phone] Fax: [phone]    Your procedure is scheduled on September 29, 2017.  [facility] Admitting at 530 AM.  Call this number if you have problems the morning of surgery:  [phone]   Remember:  Do not eat food or drink liquids after midnight.  Take these medicines the morning of surgery with A SIP OF WATER divalproex (depakote), pantoprazole (protonix), odansetron (zofran)-if needed for nausea.  7 days prior to surgery STOP taking any Aspirin (unless otherwise instructed by your surgeon), Aleve, Naproxen, Ibuprofen, Motrin, Advil, Goody's, BC's, all herbal medications, fish oil, and all vitamins  Continue all other medications as instructed by your physician except follow the above medication instructions before surgery   Do not wear jewelry, make-up or nail polish.  Do not wear lotions, powders, or perfumes, or deodorant.  Do not shave 48 hours prior to surgery.    Do not bring valuables to the hospital.  [facility] is not responsible for any belongings or valuables.  Contacts, dentures or bridgework may not be worn into surgery.  Leave your suitcase in the car.  After surgery it may be brought to your room.  For patients admitted to the hospital, discharge time will be determined by your treatment team.  Patients discharged the day of surgery will not be allowed to drive home.   Special instructions:   [facility]- Preparing For Surgery  Before surgery, you can play an important role. Because skin is not sterile, your skin needs to be as free of germs as possible. You can reduce the number of germs on your skin by washing with CHG (chlorahexidine gluconate) Soap before surgery.  CHG is an antiseptic cleaner which kills germs and bonds with the skin to continue killing germs even after  washing.  Please do not use if you have an allergy to CHG or antibacterial soaps. If your skin becomes reddened/irritated stop using the CHG.  Do not shave (including legs and underarms) for at least 48 hours prior to first CHG shower. It is OK to shave your face.  Please follow these instructions carefully.   1. Shower the NIGHT BEFORE SURGERY and the MORNING OF SURGERY with CHG.   2. If you chose to wash your hair, wash your hair first as usual with your normal shampoo.  3. After you shampoo, rinse your hair and body thoroughly to remove the shampoo.  4. Use CHG as you would any other liquid soap. You can apply CHG directly to the skin and wash gently with a scrungie or a clean washcloth.   5. Apply the CHG Soap to your body ONLY FROM THE NECK DOWN.  Do not use on open wounds or open sores. Avoid contact with your eyes, ears, mouth and genitals (private parts). Wash Face and genitals (private parts)  with your normal soap.  6. Wash thoroughly, paying special attention to the area where your surgery will be performed.  7. Thoroughly rinse your body with warm water from the neck down.  8. DO NOT shower/wash with your normal soap after using and rinsing off the CHG Soap.  9. Pat yourself dry with a CLEAN TOWEL.  10. Wear CLEAN PAJAMAS to bed the night before surgery, wear comfortable clothes the morning of surgery  11. Place CLEAN SHEETS on your bed the night  of your first shower and DO NOT SLEEP WITH PETS.    Day of Surgery: Do not apply any deodorants/lotions. Please wear clean clothes to the hospital/surgery center.     Please read over the following fact sheets that you were given. Pain Booklet, Coughing and Deep Breathing, MRSA Information and Surgical Site Infection Prevention

## 2017-09-18 ENCOUNTER — Encounter (HOSPITAL_COMMUNITY): Payer: Self-pay

## 2017-09-18 ENCOUNTER — Other Ambulatory Visit: Payer: Self-pay

## 2017-09-18 ENCOUNTER — Encounter (HOSPITAL_COMMUNITY)
Admission: RE | Admit: 2017-09-18 | Discharge: 2017-09-18 | Disposition: A | Discharge status: Home / Self Care | Payer: Federal, State, Local not specified - PPO | Source: Ambulatory Visit | Attending: Neurosurgery | Admitting: Neurosurgery

## 2017-09-18 DIAGNOSIS — Z01812 Encounter for preprocedural laboratory examination: Secondary | ICD-10-CM | POA: Diagnosis not present

## 2017-09-18 HISTORY — DX: Personal history of urinary calculi: Z87.442

## 2017-09-18 HISTORY — DX: Other cervical disc degeneration, unspecified cervical region: M50.30

## 2017-09-18 LAB — TYPE AND SCREEN
ABO/RH(D): B POS
Antibody Screen: NEGATIVE

## 2017-09-18 LAB — CBC
HEMATOCRIT: 42.3 % (ref 36.0–46.0)
HEMOGLOBIN: 14.2 g/dL (ref 12.0–15.0)
MCH: 30.1 pg (ref 26.0–34.0)
MCHC: 33.6 g/dL (ref 30.0–36.0)
MCV: 89.6 fL (ref 78.0–100.0)
Platelets: 237 10*3/uL (ref 150–400)
RBC: 4.72 MIL/uL (ref 3.87–5.11)
RDW: 13.1 % (ref 11.5–15.5)
WBC: 3.9 10*3/uL — AB (ref 4.0–10.5)

## 2017-09-18 LAB — BASIC METABOLIC PANEL
Anion gap: 7 (ref 5–15)
BUN: 17 mg/dL (ref 6–20)
CO2: 29 mmol/L (ref 22–32)
Calcium: 9.6 mg/dL (ref 8.9–10.3)
Chloride: 103 mmol/L (ref 101–111)
Creatinine, Ser: 0.66 mg/dL (ref 0.44–1.00)
GFR calc Af Amer: >60 mL/min (ref >60)
GLUCOSE: 107 mg/dL — AB (ref 65–99)
POTASSIUM: 4.3 mmol/L (ref 3.5–5.1)
Sodium: 139 mmol/L (ref 135–145)

## 2017-09-18 LAB — SURGICAL PCR SCREEN
MRSA, PCR: NEGATIVE
STAPHYLOCOCCUS AUREUS: POSITIVE — AB

## 2017-09-18 LAB — ABO/RH: ABO/RH(D): B POS

## 2017-09-18 NOTE — Progress Notes (Signed)
Denies any cardiac issues.  No murmurs, no cardiac tests done. PCP is Dr. Haze RushingYbanez  LOV 04/2017

## 2017-09-29 ENCOUNTER — Inpatient Hospital Stay (HOSPITAL_COMMUNITY): Payer: Federal, State, Local not specified - PPO

## 2017-09-29 ENCOUNTER — Inpatient Hospital Stay (HOSPITAL_COMMUNITY): Payer: Federal, State, Local not specified - PPO | Admitting: Anesthesiology

## 2017-09-29 ENCOUNTER — Encounter (HOSPITAL_COMMUNITY)
Admission: RE | Disposition: A | Discharge status: Home / Self Care | Payer: Self-pay | Source: Ambulatory Visit | Attending: Neurosurgery

## 2017-09-29 ENCOUNTER — Inpatient Hospital Stay (HOSPITAL_COMMUNITY)
Admission: RE | Admit: 2017-09-29 | Discharge: 2017-10-01 | DRG: 455 | Disposition: A | Discharge status: Home / Self Care | Payer: Federal, State, Local not specified - PPO | Source: Ambulatory Visit | Attending: Neurosurgery | Admitting: Neurosurgery

## 2017-09-29 ENCOUNTER — Encounter (HOSPITAL_COMMUNITY): Payer: Self-pay | Admitting: *Deleted

## 2017-09-29 DIAGNOSIS — Z79899 Other long term (current) drug therapy: Secondary | ICD-10-CM

## 2017-09-29 DIAGNOSIS — K589 Irritable bowel syndrome without diarrhea: Secondary | ICD-10-CM | POA: Diagnosis present

## 2017-09-29 DIAGNOSIS — K219 Gastro-esophageal reflux disease without esophagitis: Secondary | ICD-10-CM | POA: Diagnosis present

## 2017-09-29 DIAGNOSIS — Z419 Encounter for procedure for purposes other than remedying health state, unspecified: Secondary | ICD-10-CM

## 2017-09-29 DIAGNOSIS — G43909 Migraine, unspecified, not intractable, without status migrainosus: Secondary | ICD-10-CM | POA: Diagnosis present

## 2017-09-29 DIAGNOSIS — Z87442 Personal history of urinary calculi: Secondary | ICD-10-CM | POA: Diagnosis not present

## 2017-09-29 DIAGNOSIS — N319 Neuromuscular dysfunction of bladder, unspecified: Secondary | ICD-10-CM | POA: Diagnosis not present

## 2017-09-29 DIAGNOSIS — M4326 Fusion of spine, lumbar region: Secondary | ICD-10-CM | POA: Diagnosis not present

## 2017-09-29 DIAGNOSIS — Z888 Allergy status to other drugs, medicaments and biological substances status: Secondary | ICD-10-CM

## 2017-09-29 DIAGNOSIS — Z981 Arthrodesis status: Secondary | ICD-10-CM

## 2017-09-29 DIAGNOSIS — M1990 Unspecified osteoarthritis, unspecified site: Secondary | ICD-10-CM | POA: Diagnosis not present

## 2017-09-29 DIAGNOSIS — Z881 Allergy status to other antibiotic agents status: Secondary | ICD-10-CM | POA: Diagnosis not present

## 2017-09-29 DIAGNOSIS — M532X6 Spinal instabilities, lumbar region: Secondary | ICD-10-CM | POA: Diagnosis not present

## 2017-09-29 DIAGNOSIS — Z791 Long term (current) use of non-steroidal anti-inflammatories (NSAID): Secondary | ICD-10-CM | POA: Diagnosis not present

## 2017-09-29 DIAGNOSIS — M5137 Other intervertebral disc degeneration, lumbosacral region: Secondary | ICD-10-CM | POA: Diagnosis present

## 2017-09-29 DIAGNOSIS — M48061 Spinal stenosis, lumbar region without neurogenic claudication: Principal | ICD-10-CM | POA: Diagnosis present

## 2017-09-29 DIAGNOSIS — K5900 Constipation, unspecified: Secondary | ICD-10-CM | POA: Diagnosis not present

## 2017-09-29 DIAGNOSIS — M5116 Intervertebral disc disorders with radiculopathy, lumbar region: Secondary | ICD-10-CM | POA: Diagnosis not present

## 2017-09-29 DIAGNOSIS — M5136 Other intervertebral disc degeneration, lumbar region: Secondary | ICD-10-CM | POA: Diagnosis present

## 2017-09-29 DIAGNOSIS — Z882 Allergy status to sulfonamides status: Secondary | ICD-10-CM | POA: Diagnosis not present

## 2017-09-29 DIAGNOSIS — M51379 Other intervertebral disc degeneration, lumbosacral region without mention of lumbar back pain or lower extremity pain: Secondary | ICD-10-CM | POA: Diagnosis present

## 2017-09-29 HISTORY — PX: LUMBAR FUSION: SHX111

## 2017-09-29 SURGERY — POSTERIOR LUMBAR FUSION 2 LEVEL
Anesthesia: General | Site: Back

## 2017-09-29 MED ORDER — DEXAMETHASONE SODIUM PHOSPHATE 10 MG/ML IJ SOLN
10.0000 mg | INTRAMUSCULAR | Status: AC
  Administered 2017-09-29: 10 mg via INTRAVENOUS

## 2017-09-29 MED ORDER — FENTANYL CITRATE (PF) 100 MCG/2ML IJ SOLN
25.0000 ug | INTRAMUSCULAR | Status: DC | PRN
  Administered 2017-09-29 (×2): 50 ug via INTRAVENOUS

## 2017-09-29 MED ORDER — DOCUSATE SODIUM 100 MG PO CAPS: 100.0000 mg | ORAL_CAPSULE | Freq: Every day | ORAL | Status: DC | PRN

## 2017-09-29 MED ORDER — ROCURONIUM BROMIDE 100 MG/10ML IV SOLN
INTRAVENOUS | Status: DC | PRN
  Administered 2017-09-29: 20 mg via INTRAVENOUS
  Administered 2017-09-29: 40 mg via INTRAVENOUS

## 2017-09-29 MED ORDER — BUPIVACAINE HCL (PF) 0.25 % IJ SOLN
INTRAMUSCULAR | Status: AC
  Filled 2017-09-29: qty 30

## 2017-09-29 MED ORDER — BACITRACIN 50000 UNITS IM SOLR
INTRAMUSCULAR | Status: DC | PRN
  Administered 2017-09-29: 07:00:00

## 2017-09-29 MED ORDER — SUCCINYLCHOLINE CHLORIDE 200 MG/10ML IV SOSY
PREFILLED_SYRINGE | INTRAVENOUS | Status: AC
  Filled 2017-09-29: qty 10

## 2017-09-29 MED ORDER — CHLORHEXIDINE GLUCONATE CLOTH 2 % EX PADS: 6.0000 | MEDICATED_PAD | Freq: Once | CUTANEOUS | Status: DC

## 2017-09-29 MED ORDER — FENTANYL CITRATE (PF) 100 MCG/2ML IJ SOLN
INTRAMUSCULAR | Status: AC
  Administered 2017-09-29: 50 ug via INTRAVENOUS
  Filled 2017-09-29: qty 2

## 2017-09-29 MED ORDER — THROMBIN (RECOMBINANT) 5000 UNITS EX SOLR
CUTANEOUS | Status: AC
  Filled 2017-09-29: qty 5000

## 2017-09-29 MED ORDER — CEFAZOLIN SODIUM-DEXTROSE 2-4 GM/100ML-% IV SOLN
2.0000 g | INTRAVENOUS | Status: AC
  Administered 2017-09-29: 2 g via INTRAVENOUS

## 2017-09-29 MED ORDER — OXYCODONE HCL 5 MG PO TABS
10.0000 mg | ORAL_TABLET | ORAL | Status: DC | PRN
  Administered 2017-09-29 – 2017-10-01 (×4): 10 mg via ORAL
  Filled 2017-09-29 (×3): qty 2

## 2017-09-29 MED ORDER — SODIUM CHLORIDE 0.9% FLUSH
3.0000 mL | Freq: Two times a day (BID) | INTRAVENOUS | Status: DC
  Administered 2017-09-29 – 2017-10-01 (×4): 3 mL via INTRAVENOUS

## 2017-09-29 MED ORDER — 0.9 % SODIUM CHLORIDE (POUR BTL) OPTIME
TOPICAL | Status: DC | PRN
  Administered 2017-09-29: 1000 mL

## 2017-09-29 MED ORDER — ONDANSETRON HCL 4 MG/2ML IJ SOLN
INTRAMUSCULAR | Status: DC | PRN
  Administered 2017-09-29 (×2): 4 mg via INTRAVENOUS

## 2017-09-29 MED ORDER — OXYCODONE HCL 5 MG PO TABS
ORAL_TABLET | ORAL | Status: AC
  Administered 2017-09-29: 10 mg via ORAL
  Filled 2017-09-29: qty 2

## 2017-09-29 MED ORDER — HYDROMORPHONE HCL 1 MG/ML IJ SOLN
0.5000 mg | INTRAMUSCULAR | Status: DC | PRN
  Administered 2017-09-29 – 2017-10-01 (×8): 0.5 mg via INTRAVENOUS
  Filled 2017-09-29 (×6): qty 0.5

## 2017-09-29 MED ORDER — PROPOFOL 10 MG/ML IV BOLUS
INTRAVENOUS | Status: DC | PRN
  Administered 2017-09-29: 130 mg via INTRAVENOUS

## 2017-09-29 MED ORDER — PHENYLEPHRINE HCL 10 MG/ML IJ SOLN
INTRAMUSCULAR | Status: AC
  Filled 2017-09-29: qty 1

## 2017-09-29 MED ORDER — LIDOCAINE 2% (20 MG/ML) 5 ML SYRINGE
INTRAMUSCULAR | Status: AC
  Filled 2017-09-29: qty 5

## 2017-09-29 MED ORDER — SUMATRIPTAN SUCCINATE 25 MG PO TABS
25.0000 mg | ORAL_TABLET | ORAL | Status: DC | PRN
  Filled 2017-09-29: qty 1

## 2017-09-29 MED ORDER — DIVALPROEX SODIUM 125 MG PO CSDR
125.0000 mg | DELAYED_RELEASE_CAPSULE | Freq: Two times a day (BID) | ORAL | Status: DC
  Administered 2017-09-29 – 2017-10-01 (×4): 125 mg via ORAL
  Filled 2017-09-29 (×4): qty 1

## 2017-09-29 MED ORDER — CEFAZOLIN SODIUM-DEXTROSE 2-4 GM/100ML-% IV SOLN
INTRAVENOUS | Status: AC
  Filled 2017-09-29: qty 100

## 2017-09-29 MED ORDER — ROCURONIUM BROMIDE 10 MG/ML (PF) SYRINGE
PREFILLED_SYRINGE | INTRAVENOUS | Status: AC
  Filled 2017-09-29: qty 5

## 2017-09-29 MED ORDER — PHENYLEPHRINE HCL 10 MG/ML IJ SOLN
INTRAMUSCULAR | Status: DC | PRN
  Administered 2017-09-29 (×3): 80 ug via INTRAVENOUS

## 2017-09-29 MED ORDER — ACETAMINOPHEN 10 MG/ML IV SOLN
1000.0000 mg | Freq: Once | INTRAVENOUS | Status: DC
  Filled 2017-09-29: qty 100

## 2017-09-29 MED ORDER — HEMOSTATIC AGENTS (NO CHARGE) OPTIME
TOPICAL | Status: DC | PRN
  Administered 2017-09-29: 1 via TOPICAL

## 2017-09-29 MED ORDER — FENTANYL CITRATE (PF) 250 MCG/5ML IJ SOLN
INTRAMUSCULAR | Status: AC
  Filled 2017-09-29: qty 5

## 2017-09-29 MED ORDER — CEFAZOLIN SODIUM-DEXTROSE 2-4 GM/100ML-% IV SOLN
2.0000 g | Freq: Once | INTRAVENOUS | Status: AC
  Administered 2017-09-29: 2 g via INTRAVENOUS
  Filled 2017-09-29: qty 100

## 2017-09-29 MED ORDER — LACTATED RINGERS IV SOLN
INTRAVENOUS | Status: DC | PRN
  Administered 2017-09-29 (×2): via INTRAVENOUS

## 2017-09-29 MED ORDER — NEOSTIGMINE METHYLSULFATE 5 MG/5ML IV SOSY
PREFILLED_SYRINGE | INTRAVENOUS | Status: AC
  Filled 2017-09-29: qty 5

## 2017-09-29 MED ORDER — SODIUM CHLORIDE 0.9 % IV SOLN: 250.0000 mL | INTRAVENOUS | Status: DC

## 2017-09-29 MED ORDER — ALUM & MAG HYDROXIDE-SIMETH 200-200-20 MG/5ML PO SUSP: 30.0000 mL | Freq: Four times a day (QID) | ORAL | Status: DC | PRN

## 2017-09-29 MED ORDER — ACETAMINOPHEN 325 MG PO TABS
650.0000 mg | ORAL_TABLET | ORAL | Status: DC | PRN
  Administered 2017-09-30: 650 mg via ORAL
  Filled 2017-09-29: qty 2

## 2017-09-29 MED ORDER — NEOSTIGMINE METHYLSULFATE 10 MG/10ML IV SOLN
INTRAVENOUS | Status: DC | PRN
  Administered 2017-09-29: 2 mg via INTRAVENOUS

## 2017-09-29 MED ORDER — THROMBIN (RECOMBINANT) 20000 UNITS EX SOLR
CUTANEOUS | Status: AC
  Filled 2017-09-29: qty 20000

## 2017-09-29 MED ORDER — ONDANSETRON 4 MG PO TBDP: 4.0000 mg | ORAL_TABLET | Freq: Three times a day (TID) | ORAL | Status: DC | PRN

## 2017-09-29 MED ORDER — HYDROMORPHONE HCL 1 MG/ML IJ SOLN
INTRAMUSCULAR | Status: AC
  Administered 2017-09-30: 0.5 mg via INTRAVENOUS
  Filled 2017-09-29: qty 1

## 2017-09-29 MED ORDER — ACETAMINOPHEN 650 MG RE SUPP: 650.0000 mg | RECTAL | Status: DC | PRN

## 2017-09-29 MED ORDER — LIDOCAINE HCL (CARDIAC) 20 MG/ML IV SOLN
INTRAVENOUS | Status: DC | PRN
  Administered 2017-09-29: 80 mg via INTRAVENOUS

## 2017-09-29 MED ORDER — MIDAZOLAM HCL 5 MG/5ML IJ SOLN
INTRAMUSCULAR | Status: DC | PRN
  Administered 2017-09-29: 2 mg via INTRAVENOUS

## 2017-09-29 MED ORDER — ACETAMINOPHEN 10 MG/ML IV SOLN
INTRAVENOUS | Status: DC | PRN
  Administered 2017-09-29: 1000 mg via INTRAVENOUS

## 2017-09-29 MED ORDER — METOCLOPRAMIDE HCL 5 MG/ML IJ SOLN
INTRAMUSCULAR | Status: AC
  Filled 2017-09-29: qty 2

## 2017-09-29 MED ORDER — MENTHOL 3 MG MT LOZG: 1.0000 | LOZENGE | OROMUCOSAL | Status: DC | PRN

## 2017-09-29 MED ORDER — THROMBIN (RECOMBINANT) 20000 UNITS EX SOLR
CUTANEOUS | Status: DC | PRN
  Administered 2017-09-29: 20000 [IU] via TOPICAL

## 2017-09-29 MED ORDER — BUPIVACAINE LIPOSOME 1.3 % IJ SUSP
20.0000 mL | Freq: Once | INTRAMUSCULAR | Status: AC
  Administered 2017-09-29: 20 mL
  Filled 2017-09-29: qty 20

## 2017-09-29 MED ORDER — PANTOPRAZOLE SODIUM 40 MG IV SOLR
40.0000 mg | Freq: Every day | INTRAVENOUS | Status: AC
  Administered 2017-09-29: 40 mg via INTRAVENOUS
  Filled 2017-09-29: qty 40

## 2017-09-29 MED ORDER — CEFAZOLIN SODIUM-DEXTROSE 2-4 GM/100ML-% IV SOLN
2.0000 g | Freq: Three times a day (TID) | INTRAVENOUS | Status: AC
  Administered 2017-09-29 – 2017-10-01 (×6): 2 g via INTRAVENOUS
  Filled 2017-09-29 (×6): qty 100

## 2017-09-29 MED ORDER — MIDAZOLAM HCL 2 MG/2ML IJ SOLN
INTRAMUSCULAR | Status: AC
  Filled 2017-09-29: qty 2

## 2017-09-29 MED ORDER — VANCOMYCIN HCL 1000 MG IV SOLR
INTRAVENOUS | Status: DC | PRN
  Administered 2017-09-29: 1000 mg

## 2017-09-29 MED ORDER — PANTOPRAZOLE SODIUM 40 MG PO TBEC
40.0000 mg | DELAYED_RELEASE_TABLET | Freq: Every day | ORAL | Status: DC
  Filled 2017-09-29 (×2): qty 1

## 2017-09-29 MED ORDER — ONDANSETRON HCL 4 MG/2ML IJ SOLN: 4.0000 mg | Freq: Four times a day (QID) | INTRAMUSCULAR | Status: DC | PRN

## 2017-09-29 MED ORDER — NAPROXEN SODIUM 275 MG PO TABS
275.0000 mg | ORAL_TABLET | Freq: Two times a day (BID) | ORAL | Status: DC
  Administered 2017-09-29 – 2017-10-01 (×3): 275 mg via ORAL
  Filled 2017-09-29 (×4): qty 1

## 2017-09-29 MED ORDER — VANCOMYCIN HCL 1000 MG IV SOLR
INTRAVENOUS | Status: AC
  Filled 2017-09-29: qty 1000

## 2017-09-29 MED ORDER — CYCLOBENZAPRINE HCL 10 MG PO TABS
10.0000 mg | ORAL_TABLET | Freq: Three times a day (TID) | ORAL | Status: DC | PRN
  Filled 2017-09-29 (×2): qty 1

## 2017-09-29 MED ORDER — FENTANYL CITRATE (PF) 100 MCG/2ML IJ SOLN
INTRAMUSCULAR | Status: DC | PRN
  Administered 2017-09-29: 50 ug via INTRAVENOUS
  Administered 2017-09-29: 100 ug via INTRAVENOUS
  Administered 2017-09-29 (×2): 50 ug via INTRAVENOUS

## 2017-09-29 MED ORDER — PHENOL 1.4 % MT LIQD: 1.0000 | OROMUCOSAL | Status: DC | PRN

## 2017-09-29 MED ORDER — ARTIFICIAL TEARS OPHTHALMIC OINT
TOPICAL_OINTMENT | OPHTHALMIC | Status: DC | PRN
  Administered 2017-09-29: 1 via OPHTHALMIC

## 2017-09-29 MED ORDER — PHENYLEPHRINE 40 MCG/ML (10ML) SYRINGE FOR IV PUSH (FOR BLOOD PRESSURE SUPPORT)
PREFILLED_SYRINGE | INTRAVENOUS | Status: AC
  Filled 2017-09-29: qty 10

## 2017-09-29 MED ORDER — LIDOCAINE-EPINEPHRINE 1 %-1:100000 IJ SOLN
INTRAMUSCULAR | Status: DC | PRN
  Administered 2017-09-29: 8 mL

## 2017-09-29 MED ORDER — BUPIVACAINE HCL (PF) 0.25 % IJ SOLN
INTRAMUSCULAR | Status: DC | PRN
  Administered 2017-09-29: 8 mL

## 2017-09-29 MED ORDER — PROPOFOL 10 MG/ML IV BOLUS
INTRAVENOUS | Status: AC
  Filled 2017-09-29: qty 20

## 2017-09-29 MED ORDER — ONDANSETRON HCL 4 MG PO TABS
4.0000 mg | ORAL_TABLET | Freq: Four times a day (QID) | ORAL | Status: DC | PRN
Start: 2017-09-29 — End: 2017-10-01

## 2017-09-29 MED ORDER — LIDOCAINE-EPINEPHRINE 1 %-1:100000 IJ SOLN
INTRAMUSCULAR | Status: AC
  Filled 2017-09-29: qty 1

## 2017-09-29 MED ORDER — SODIUM CHLORIDE 0.9% FLUSH: 3.0000 mL | INTRAVENOUS | Status: DC | PRN

## 2017-09-29 MED ORDER — GLYCOPYRROLATE 0.2 MG/ML IJ SOLN
INTRAMUSCULAR | Status: DC | PRN
  Administered 2017-09-29: 0.3 mg via INTRAVENOUS

## 2017-09-29 MED ORDER — SUCCINYLCHOLINE CHLORIDE 20 MG/ML IJ SOLN
INTRAMUSCULAR | Status: DC | PRN
  Administered 2017-09-29: 100 mg via INTRAVENOUS

## 2017-09-29 MED ORDER — ARTIFICIAL TEARS OPHTHALMIC OINT
TOPICAL_OINTMENT | OPHTHALMIC | Status: AC
  Filled 2017-09-29: qty 3.5

## 2017-09-29 MED ORDER — DEXAMETHASONE SODIUM PHOSPHATE 10 MG/ML IJ SOLN
INTRAMUSCULAR | Status: AC
  Filled 2017-09-29: qty 1

## 2017-09-29 MED ORDER — DEXTROSE 5 % IV SOLN
INTRAVENOUS | Status: DC | PRN
  Administered 2017-09-29: 25 ug/min via INTRAVENOUS

## 2017-09-29 MED ORDER — ONDANSETRON HCL 4 MG/2ML IJ SOLN
INTRAMUSCULAR | Status: AC
  Filled 2017-09-29: qty 4

## 2017-09-29 MED ORDER — METOCLOPRAMIDE HCL 5 MG/ML IJ SOLN
INTRAMUSCULAR | Status: DC | PRN
  Administered 2017-09-29: 10 mg via INTRAVENOUS

## 2017-09-29 MED ORDER — HYDROMORPHONE HCL 1 MG/ML IJ SOLN
INTRAMUSCULAR | Status: AC
  Administered 2017-09-29: 0.5 mg via INTRAVENOUS
  Filled 2017-09-29: qty 1

## 2017-09-29 SURGICAL SUPPLY — 82 items
BAG DECANTER FOR FLEXI CONT (MISCELLANEOUS) ×2 IMPLANT
BASKET BONE COLLECTION (BASKET) ×2 IMPLANT
BENZOIN TINCTURE PRP APPL 2/3 (GAUZE/BANDAGES/DRESSINGS) ×2 IMPLANT
BIT DRILL 5.0/4.0 (BIT) ×1 IMPLANT
BLADE CLIPPER SURG (BLADE) IMPLANT
BLADE SURG 11 STRL SS (BLADE) ×2 IMPLANT
BONE VIVIGEN FORMABLE 5.4CC (Bone Implant) ×2 IMPLANT
BUR CUTTER 7.0 ROUND (BURR) ×2 IMPLANT
BUR MATCHSTICK NEURO 3.0 LAGG (BURR) ×2 IMPLANT
CAGE ALTERA 10X31X9-13 15D (Cage) ×2 IMPLANT
CAGE ALTERA 8X12-8 (Cage) ×2 IMPLANT
CANISTER SUCT 3000ML PPV (MISCELLANEOUS) ×2 IMPLANT
CAP LOCKING (Cap) ×6 IMPLANT
CAP LOCKING 5.5 CREO (Cap) ×6 IMPLANT
CARTRIDGE OIL MAESTRO DRILL (MISCELLANEOUS) ×1 IMPLANT
CONT SPEC 4OZ CLIKSEAL STRL BL (MISCELLANEOUS) ×2 IMPLANT
COVER BACK TABLE 60X90IN (DRAPES) ×2 IMPLANT
DECANTER SPIKE VIAL GLASS SM (MISCELLANEOUS) ×2 IMPLANT
DERMABOND ADVANCED (GAUZE/BANDAGES/DRESSINGS) ×1
DERMABOND ADVANCED .7 DNX12 (GAUZE/BANDAGES/DRESSINGS) ×1 IMPLANT
DIFFUSER DRILL AIR PNEUMATIC (MISCELLANEOUS) ×2 IMPLANT
DRAPE C-ARM 42X72 X-RAY (DRAPES) ×2 IMPLANT
DRAPE C-ARMOR (DRAPES) ×2 IMPLANT
DRAPE HALF SHEET 40X57 (DRAPES) IMPLANT
DRAPE LAPAROTOMY 100X72X124 (DRAPES) ×2 IMPLANT
DRAPE POUCH INSTRU U-SHP 10X18 (DRAPES) ×2 IMPLANT
DRAPE SURG 17X23 STRL (DRAPES) ×2 IMPLANT
DRILL 5.0/4.0 (BIT) ×2
DRSG OPSITE POSTOP 3X4 (GAUZE/BANDAGES/DRESSINGS) ×2 IMPLANT
DRSG OPSITE POSTOP 4X8 (GAUZE/BANDAGES/DRESSINGS) ×2 IMPLANT
DURAPREP 26ML APPLICATOR (WOUND CARE) ×2 IMPLANT
ELECT REM PT RETURN 9FT ADLT (ELECTROSURGICAL) ×2
ELECTRODE REM PT RTRN 9FT ADLT (ELECTROSURGICAL) ×1 IMPLANT
EVACUATOR 1/8 PVC DRAIN (DRAIN) ×2 IMPLANT
EVACUATOR 3/16  PVC DRAIN (DRAIN) ×1
EVACUATOR 3/16 PVC DRAIN (DRAIN) ×1 IMPLANT
FIBER CORTICAL ALLOFUSE 5CC (Bone Implant) ×2 IMPLANT
GAUZE SPONGE 4X4 12PLY STRL (GAUZE/BANDAGES/DRESSINGS) ×2 IMPLANT
GAUZE SPONGE 4X4 16PLY XRAY LF (GAUZE/BANDAGES/DRESSINGS) IMPLANT
GLOVE BIO SURGEON STRL SZ7 (GLOVE) IMPLANT
GLOVE BIO SURGEON STRL SZ8 (GLOVE) ×4 IMPLANT
GLOVE BIOGEL PI IND STRL 7.0 (GLOVE) IMPLANT
GLOVE BIOGEL PI INDICATOR 7.0 (GLOVE)
GLOVE ECLIPSE 6.5 STRL STRAW (GLOVE) ×2 IMPLANT
GLOVE EXAM NITRILE LRG STRL (GLOVE) IMPLANT
GLOVE EXAM NITRILE XL STR (GLOVE) IMPLANT
GLOVE EXAM NITRILE XS STR PU (GLOVE) IMPLANT
GLOVE INDICATOR 7.0 STRL GRN (GLOVE) ×2 IMPLANT
GLOVE INDICATOR 8.5 STRL (GLOVE) ×4 IMPLANT
GLOVE SURG SS PI 6.5 STRL IVOR (GLOVE) ×6 IMPLANT
GLOVE SURG SS PI 7.5 STRL IVOR (GLOVE) ×4 IMPLANT
GOWN STRL REUS W/ TWL LRG LVL3 (GOWN DISPOSABLE) ×3 IMPLANT
GOWN STRL REUS W/ TWL XL LVL3 (GOWN DISPOSABLE) ×2 IMPLANT
GOWN STRL REUS W/TWL 2XL LVL3 (GOWN DISPOSABLE) IMPLANT
GOWN STRL REUS W/TWL LRG LVL3 (GOWN DISPOSABLE) ×3
GOWN STRL REUS W/TWL XL LVL3 (GOWN DISPOSABLE) ×2
HEMOSTAT POWDER KIT SURGIFOAM (HEMOSTASIS) IMPLANT
KIT BASIN OR (CUSTOM PROCEDURE TRAY) ×2 IMPLANT
KIT ROOM TURNOVER OR (KITS) ×2 IMPLANT
MILL MEDIUM DISP (BLADE) ×2 IMPLANT
NEEDLE HYPO 21X1.5 SAFETY (NEEDLE) ×2 IMPLANT
NEEDLE HYPO 25X1 1.5 SAFETY (NEEDLE) ×2 IMPLANT
NS IRRIG 1000ML POUR BTL (IV SOLUTION) ×2 IMPLANT
OIL CARTRIDGE MAESTRO DRILL (MISCELLANEOUS) ×2
PACK LAMINECTOMY NEURO (CUSTOM PROCEDURE TRAY) ×2 IMPLANT
PAD ARMBOARD 7.5X6 YLW CONV (MISCELLANEOUS) ×6 IMPLANT
ROD CREO STRAIGHT 75MM (Rod) ×2 IMPLANT
ROD SPINE STR CREO 5.5X65 (Rod) ×2 IMPLANT
SCREW CORT CREO 6.0-5.0X30MM (Screw) ×8 IMPLANT
SCREW CREO 5.5 4.0/5.0X30 (Screw) ×4 IMPLANT
SPONGE LAP 4X18 X RAY DECT (DISPOSABLE) IMPLANT
SPONGE SURGIFOAM ABS GEL 100 (HEMOSTASIS) ×2 IMPLANT
STRIP CLOSURE SKIN 1/2X4 (GAUZE/BANDAGES/DRESSINGS) ×2 IMPLANT
SUT VIC AB 0 CT1 18XCR BRD8 (SUTURE) ×2 IMPLANT
SUT VIC AB 0 CT1 8-18 (SUTURE) ×2
SUT VIC AB 2-0 CT1 18 (SUTURE) ×2 IMPLANT
SUT VIC AB 4-0 PS2 27 (SUTURE) ×2 IMPLANT
SYR 20CC LL (SYRINGE) ×2 IMPLANT
TOWEL GREEN STERILE (TOWEL DISPOSABLE) ×2 IMPLANT
TOWEL GREEN STERILE FF (TOWEL DISPOSABLE) ×2 IMPLANT
TRAY FOLEY W/METER SILVER 16FR (SET/KITS/TRAYS/PACK) ×2 IMPLANT
WATER STERILE IRR 1000ML POUR (IV SOLUTION) ×2 IMPLANT

## 2017-09-29 NOTE — Anesthesia Postprocedure Evaluation (Signed)
Anesthesia Post Note  Patient: Claudia Bowen  Procedure(s) Performed: Posterior Lumbar Interbody Fusion  - Lumbar two-Lumbar three- Lumbar three lumbar four (N/A Back)     Patient location during evaluation: PACU Anesthesia Type: General Level of consciousness: awake Pain management: pain level controlled Respiratory status: spontaneous breathing Cardiovascular status: stable Anesthetic complications: no    Last Vitals:  Vitals:   09/29/17 1530 09/29/17 1602  BP:  (!) 152/75  Pulse: 87 90  Resp:  19  Temp:  36.7 C  SpO2: 100% 97%    Last Pain:  Vitals:   09/29/17 1602  TempSrc: Oral  PainSc:                  Shantel Helwig

## 2017-09-29 NOTE — Anesthesia Preprocedure Evaluation (Addendum)
Anesthesia Evaluation  Patient identified by MRN, date of birth, ID band Patient awake    Reviewed: Allergy & Precautions, NPO status , Patient's Chart, lab work & pertinent test results  Airway Mallampati: II  TM Distance: >3 FB Neck ROM: Limited    Dental   Pulmonary pneumonia,    breath sounds clear to auscultation       Cardiovascular + Peripheral Vascular Disease   Rhythm:Regular Rate:Normal     Neuro/Psych  Headaches,    GI/Hepatic Neg liver ROS, GERD  ,  Endo/Other  negative endocrine ROS  Renal/GU negative Renal ROS     Musculoskeletal  (+) Arthritis ,   Abdominal   Peds  Hematology   Anesthesia Other Findings   Reproductive/Obstetrics                            Anesthesia Physical Anesthesia Plan  ASA: III  Anesthesia Plan: General   Post-op Pain Management:    Induction: Intravenous  PONV Risk Score and Plan: 3 and Ondansetron, Dexamethasone and Midazolam  Airway Management Planned: Video Laryngoscope Planned  Additional Equipment:   Intra-op Plan:   Post-operative Plan: Possible Post-op intubation/ventilation  Informed Consent: I have reviewed the patients History and Physical, chart, labs and discussed the procedure including the risks, benefits and alternatives for the proposed anesthesia with the patient or authorized representative who has indicated his/her understanding and acceptance.   Dental advisory given  Plan Discussed with: CRNA and Anesthesiologist  Anesthesia Plan Comments:         Anesthesia Quick Evaluation

## 2017-09-29 NOTE — Op Note (Signed)
Preoperative diagnosis: Lumbar spinal stenosis instability degenerative disc disease L2-3 L3-4 above a previous L4-S1 fusion. With bilateral L3 and L4 radiculopathies.  Postoperative diagnosis: Same  Procedure: #1 transforaminal interbody fusions L2-3 L3-4 with foraminotomies of the L2, L3, L4 nerve roots. Utilizing the globus Alterra transforaminal expandable interbody cage in the interspace packed with locally harvested autograft mixed with vivigenand [name] cortical fibers  #2 cortical screw fixation L2-L4 utilizing the globus creole modular cortical screw set  Surgeon: Jillyn HiddenGary Anniebelle Devore  Asst.: Barbaraann BarthelKyle Cabell  Anesthesia: Gen.  EBL: Middle  History of present illness: 61 year old female with long sitting back pain previous L4-S1 fusion presented with progressive worsening back pain with evidence of instability and severe spinal stenosis L2-3 and L3-4 above. Previous L4-S1 fusion. Due to her progressive clinical syndrome imaging findings of a conservative treatment I recommended decompression sterilization procedures at those 2 levels. I extensively went over the risks and benefits of the operation with her as well as perioperative course expectations of outcome alternatives of surgery and she understood and agreed to proceed forward.  Operative procedure: Patient brought into the or was induced under general anesthesia positioned prone the Wilson frame her back was prepped and draped in routine sterile fashion preoperative x-ray localized the appropriate level so after infiltration 10 mL lidocaine with epi a midline incision made through the scar tissue and the scar was dissected free exposing the laminar complexes at L2 and L3 and the joints at 23 and 34.Then laminotomies were begunpreserving the spinous process and int ligament at T3 bilaterally and 34 bilaterally complete medial facetectomies were performed L plate was removed in piecemeal fashion was marked spondylosis and severe hourglass  conversant thecal sac primarily at L3 for this is all removed in piecemeal fashion to gain access to the lateral aspect the disc space at all levels. After adequate decompression achieved and foraminotomies were performed at the L2, L3, L4 nerve roots attention taken to the interbody work utilizing first working at L2-3 disc space was incised cleanout bilaterally utilizing sequential distraction with a 10 distractor in place I selected an 8 expandable cage at L2-3. Packed the cage with autograft mixed after adequate endplate preparation discectomy been achieved cages inserted it was attempted to be kicked across the midline and then it was expanded. This helped open up the disc space as well as reduce some of the deformity. Then this is packed with Gelfoam and aggressive amount of autograft mix was packed on the contralateral side as well as anterior to the cage prior to placement. At L3-4 and a similar fashion but this cages placed from the right aggressive amount of autograft mix was packed contralateral side as well as anterior to the cage. In addition after placement of the cages and packed some cortical 5 premix behind the cage in the disc space. All foraminal reinspected confirm patency no migration of graft material. Then testing the course cortical screw placement using a high-speed drill pilot holes were drilled pedicles were cannulated drilled probed and [id number] screws were inserted at L3 and L4 bilaterally and 50 by 40's were inserted at L2 all screws excellent purchase postop AP and lateral fluoroscopy confirmed adequate position of the implants. The pedicles were extended with small at all levels L2-L3 and L4 bit of lateral migration on the x-ray of the L4 pedicle on the right however because these pedicles were very small was felt that I still had solid bony purchase and there was anything in that spot that been to  the screw could be irritating and bicortical person purchase in her situation might be  helpful. I elected not to reposition the screws or the implants. Then connecting the rods aggravating down reinspected the foramen to confirm patency injected X Braun the fascia spinal vancomycin in the wound and closed in layers after placement of a medium Hemovac drain with interrupted Vicryl. A running 4 septic or and skin Dermabond benzo and Steri-Strips and a sterile dressing was applied and patient recovered in stable condition. At the end of case all needle counts sponge counts were correct.

## 2017-09-29 NOTE — Transfer of Care (Signed)
Immediate Anesthesia Transfer of Care Note  Patient: Claudia Bowen  Procedure(s) Performed: Posterior Lumbar Interbody Fusion  - Lumbar two-Lumbar three- Lumbar three lumbar four (N/A Back)  Patient Location: PACU  Anesthesia Type:General  Level of Consciousness: awake, alert , oriented and patient cooperative  Airway & Oxygen Therapy: Patient Spontanous Breathing and Patient connected to nasal cannula oxygen  Post-op Assessment: Report given to RN and Post -op Vital signs reviewed and stable  Post vital signs: Reviewed and stable  Last Vitals:  Vitals:   09/29/17 0539  BP: (!) 164/87  Pulse: 76  Resp: 20  Temp: 36.4 C  SpO2: 100%    Last Pain:  Vitals:   09/29/17 0609  TempSrc:   PainSc: 7       Patients Stated Pain Goal: 4 (09/29/17 [id number])  Complications: No apparent anesthesia complications

## 2017-09-29 NOTE — H&P (Signed)
Claudia Bowen is an 61 y.o. female.   Chief Complaint:  BACK PAIN HPI: 61YEAR_OLD FEMALE WITH LONG_STANDING ISSUES WITH HER NECK AND HER BACK SHE PREVIOUSLY UNDERGONE AN L4-1 fusion. She's got progressive breakdown stenosis at L2-3 and L3-4. Due to patient's failure conservative treatment imaging findings and progressive clinical syndrome I recomed a decompression and stabilization procedure at L2-3 and L3-4. I've extensively gone over the risks and benefits of that operation with her as wele expectations of outcome alternatives of surgery and she understands and agrees to proceed forward.  Past Medical History:  Diagnosis Date  . Chronic back pain    degenerative disc  . Complication of anesthesia    pt has a neurogenic bladder and states that she has to go home with a foley catheter  . Constipation   . DDD (degenerative disc disease), cervical    has cervical & lumbar  . GERD (gastroesophageal reflux disease)    takes Protonix daily  . Headache(784.0)   . Hemorrhoids    has never had hemorrhoids  . History of kidney stones    last flare up was 2012  . IBS (irritable bowel syndrome)   . Migraine    last migraine was 09/15/2017  . Neck pain    buldging disc  . Neurogenic bladder   . Oligouria    has never had per the patient  . Pneumonia    at age 61  . Raynaud's disease     Past Surgical History:  Procedure Laterality Date  . ANTERIOR CERVICAL DECOMP/DISCECTOMY FUSION  09/27/2011   Procedure: ANTERIOR CERVICAL DECOMPRESSION/DISCECTOMY FUSION 3 LEVELS;  Surgeon: Mariam DollarGary P Emerald Gehres;  Location: [facility];  Service: Neurosurgery;  Laterality: N/A;  Cervical Three-Four Cervical Four-Five, Cervical Six-Seven Anterior Cervical Decompression Fusion with exploration of fusion Cervical Five-Six- Removal of Hardware (Atlantis Plate) cadaver bone/Atlantis Translational Rm # 33 to follow  . BACK SURGERY  2006  . CESAREAN SECTION  1987/1992  . DILATION AND CURETTAGE OF UTERUS     at age  80/24/31  . HERNIA REPAIR     at age 61  . NECK SURGERY  2006  . pilondial cyst     at age 61 x 2  . renal stents  2005/2007  . TONSILLECTOMY     and adenoidectomy as a child  . TUBAL LIGATION      Family History  Problem Relation Age of Onset  . Anesthesia problems Neg Hx   . Hypotension Neg Hx   . Malignant hyperthermia Neg Hx   . Pseudochol deficiency Neg Hx    Social History:  reports that  has never smoked. she has never used smokeless tobacco. She reports that she does not drink alcohol or use drugs.  Allergies:  Allergies  Allergen Reactions  . Sulfa Antibiotics Anaphylaxis  . Levofloxacin Hives  . Gabapentin Nausea Only    UNSPECIFIED REACTION   . Cyclobenzaprine Other (See Comments)    Dizziness    Medications Prior to Admission  Medication Sig Dispense Refill  . divalproex (DEPAKOTE SPRINKLE) 125 MG capsule Take 125 mg by mouth 2 (two) times daily.    Marland Kitchen. docusate sodium (COLACE) 100 MG capsule Take 100 mg by mouth daily as needed for mild constipation.    . naproxen sodium (ALEVE) 220 MG tablet Take 220 mg by mouth every 12 (twelve) hours.    . ondansetron (ZOFRAN-ODT) 4 MG disintegrating tablet Take 4 mg by mouth every 8 (eight) hours as needed for nausea  or vomiting.   3  . pantoprazole (PROTONIX) 40 MG tablet Take 40 mg by mouth daily.      . rizatriptan (MAXALT) 10 MG tablet Take 3.33 mg by mouth as needed for migraine.   11  . SUMAtriptan (IMITREX) 50 MG tablet Take 25 mg by mouth every 2 (two) hours as needed for migraine. May repeat in 2 hours if headache persists or recurs.      No results found for this or any previous visit (from the past 48 hour(s)). No results found.  Review of Systems  Musculoskeletal: Positive for back pain and neck pain.  Neurological: Positive for tingling and sensory change.    Blood pressure (!) 164/87, pulse 76, temperature 97.6 F (36.4 C), temperature source Oral, resp. rate 20, weight 57.2 kg (126 lb), SpO2 100  %. Physical Exam  Constitutional: She is oriented to person, place, and time. She appears well-developed.  HENT:  Head: Normocephalic.  Eyes: Pupils are equal, round, and reactive to light.  Neck: Normal range of motion.  GI: Soft.  Musculoskeletal: Normal range of motion.  Neurological: She is alert and oriented to person, place, and time. She has normal strength. GCS eye subscore is 4. GCS verbal subscore is 5. GCS motor subscore is 6.  Strength 5 out of 5 iliopsoas, quads, hamstrings, gastrocs, anterior tibialis, and E     Assessment/Plan 61 year old female presents for an L2-3 L3-4 posterior lumbar interbody fusion  Aydrien Froman P, MD 09/29/2017, 7:21 AM

## 2017-09-29 NOTE — Progress Notes (Signed)
Pt arrived to 3W13 via stretcher.  Pt alert and oriented, dressing on the back clean dry and intact, hemovac to suction.  VSS, pain 6/10.  Will continue to monitor.  Sondra ComeSilva, Nivan Melendrez M, RN

## 2017-09-30 ENCOUNTER — Other Ambulatory Visit: Payer: Self-pay

## 2017-09-30 ENCOUNTER — Encounter (HOSPITAL_COMMUNITY): Payer: Self-pay | Admitting: General Practice

## 2017-09-30 NOTE — Progress Notes (Signed)
Neurosurgery Progress Note  No issues overnight.  Incisional pain Radicular symptoms resolved No concerns this am  EXAM:  BP 111/67 (BP Location: Left Arm) Comment: noted  Pulse 81   Temp 98.6 F (37 C) (Oral)   Resp 20   Wt 57.2 kg (126 lb)   SpO2 100%   BMI 21.63 kg/m   Awake, alert, oriented  Speech fluent, appropriate  CN grossly intact  MAEW with good strength Incision c/d/i  PLAN Doing well this am. Will work with therapy today. Incision site pain: not unexpected. Manageable. Neurogenic bladder: maintain Foley

## 2017-09-30 NOTE — Evaluation (Signed)
Occupational Therapy Evaluation Patient Details Name: Claudia NakayamaRicki L Bowen MRN: [id number] DOB: Dec 14, 1955 Today's Date: 09/30/2017    History of Present Illness Patient is a 62 y/o female admitted with Lumbar spinal stenosis instability degenerative disc disease L2-3 L3-4 above a previous L4-S1 fusion. With bilateral L3 and L4 radiculopathies, now s/p TLIF L2-3, L 3-4 with foraminotomies of the L2, L3, L4 nerve roots.   Clinical Impression   PTA, pt was living with her husband and was independent. Pt currently performing ADLs at supervision level with exception for LB ADLs where she requires Mod A. Pt would benefit form further acute OT to address LB ADLs with AE. Recommend dc home once medically stable per phsyicain.     Follow Up Recommendations  No OT follow up;Supervision/Assistance - 24 hour    Equipment Recommendations  None recommended by OT    Recommendations for Other Services PT consult     Precautions / Restrictions Precautions Precautions: Fall;Back Precaution Booklet Issued: Yes (comment) Precaution Comments: Reviewed back precautions Required Braces or Orthoses: Spinal Brace Spinal Brace: Applied in sitting position Restrictions Weight Bearing Restrictions: No      Mobility Bed Mobility Overal bed mobility: Needs Assistance Bed Mobility: Rolling;Sidelying to Sit;Sit to Sidelying Rolling: Supervision Sidelying to sit: Supervision     Sit to sidelying: Supervision General bed mobility comments: VCs to adhere to log roll. supervision for safety  Transfers Overall transfer level: Needs assistance Equipment used: None Transfers: Sit to/from Stand Sit to Stand: Supervision         General transfer comment: supervision for safety    Balance Overall balance assessment: Needs assistance   Sitting balance-Leahy Scale: Good     Standing balance support: No upper extremity supported;During functional activity Standing balance-Leahy Scale: Fair                              ADL either performed or assessed with clinical judgement   ADL Overall ADL's : Needs assistance/impaired Eating/Feeding: Set up;Sitting   Grooming: Set up;Supervision/safety;Standing   Upper Body Bathing: Set up;Sitting   Lower Body Bathing: Moderate assistance;Sit to/from stand   Upper Body Dressing : Supervision/safety;Set up;Sitting Upper Body Dressing Details (indicate cue type and reason): cues for managing brace Lower Body Dressing: Moderate assistance;Sit to/from stand Lower Body Dressing Details (indicate cue type and reason): Unable to bring ankles to knees without pain.Pt would benefit education on reacher for LB dressing and practicing Toilet Transfer: Supervision/safety;Ambulation;Regular Toilet           Functional mobility during ADLs: Supervision/safety General ADL Comments: Pt performing ADLs at supervision level. Would benefit from further OT to address LB dressing.      Vision         Perception     Praxis      Pertinent Vitals/Pain Pain Assessment: 0-10 Pain Score: 9  Pain Location: lumbar area Pain Descriptors / Indicators: Operative site guarding Pain Intervention(s): Monitored during session;Repositioned;Patient requesting pain meds-RN notified     Hand Dominance Right   Extremity/Trunk Assessment Upper Extremity Assessment Upper Extremity Assessment: Overall WFL for tasks assessed   Lower Extremity Assessment Lower Extremity Assessment: Defer to PT evaluation   Cervical / Trunk Assessment Cervical / Trunk Assessment: Other exceptions Cervical / Trunk Exceptions: s/p TLIF L2-3, L 3-4 with foraminotomies of the L2, L3, L4 nerve roots.   Communication Communication Communication: No difficulties   Cognition Arousal/Alertness: Awake/alert Behavior During Therapy: WFL for tasks assessed/performed  Overall Cognitive Status: Within Functional Limits for tasks assessed                                      General Comments  Educated pt on sleeping positions    Exercises     Shoulder Instructions      Home Living Family/patient expects to be discharged to:: Private residence Living Arrangements: Spouse/significant other Available Help at Discharge: Family Type of Home: House Home Access: Stairs to enter Secretary/administrator of Steps: 1   Home Layout: One level     Bathroom Shower/Tub: Producer, television/film/video: Standard     Home Equipment: None          Prior Functioning/Environment Level of Independence: Independent        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 Problem List: Decreased strength;Decreased range of motion;Impaired balance (sitting and/or standing);Decreased activity tolerance;Decreased knowledge of use of DME or AE;Decreased knowledge of precautions;Pain      OT Treatment/Interventions: Self-care/ADL training;Therapeutic exercise;Energy conservation;DME and/or AE instruction;Therapeutic activities;Patient/family education    OT Goals(Current goals can be found in the care plan section) Acute Rehab OT Goals Patient Stated Goal: To go home OT Goal Formulation: With patient Time For Goal Achievement: 10/14/17 Potential to Achieve Goals: Good ADL Goals Pt Will Perform Lower Body Dressing: with modified independence;with adaptive equipment;sit to/from stand Additional ADL Goal #1: Pt will independently verbalize 3/3 back precautions  OT Frequency: Min 2X/week   Barriers to D/C:            Co-evaluation              AM-PAC PT "6 Clicks" Daily Activity     Outcome Measure Help from another person eating meals?: None Help from another person taking care of personal grooming?: None Help from another person toileting, which includes using toliet, bedpan, or urinal?: A Little Help from another person bathing (including washing, rinsing, drying)?: A Little Help from another person to put on and taking off regular upper body  clothing?: A Little Help from another person to put on and taking off regular lower body clothing?: A Little 6 Click Score: 20   End of Session Equipment Utilized During Treatment: Back brace Nurse Communication: Mobility status;Patient requests pain meds  Activity Tolerance: Patient tolerated treatment well Patient left: in bed;with call bell/phone within reach  OT Visit Diagnosis: Unsteadiness on feet (R26.81);Other abnormalities of gait and mobility (R26.89);Muscle weakness (generalized) (M62.81);Pain Pain - part of body: (Back)                Time: 9604-5409 OT Time Calculation (min): 20 min Charges:  OT General Charges $OT Visit: 1 Visit OT Evaluation $OT Eval Low Complexity: 1 Low G-Codes:     Aquilla Voiles MSOT, OTR/L Acute Rehab Pager: [phone] Office: [phone]  Theodoro Grist Deaire Mcwhirter 09/30/2017, 5:27 PM

## 2017-09-30 NOTE — Evaluation (Signed)
Physical Therapy Evaluation Patient Details Name: Claudia Bowen MRN: [id number] DOB: October 14, 1955 Today's Date: Bowen   History of Present Illness  Patient is a 62 y/o female admitted with Lumbar spinal stenosis instability degenerative disc disease L2-3 L3-4 above a previous L4-S1 fusion. With bilateral L3 and L4 radiculopathies, now s/p TLIF L2-3, L 3-4 with foraminotomies of the L2, L3, L4 nerve roots.  Clinical Impression  Patient presents with decreased mobility due to back precautions and generalized weakness.  Currently min A for ambulation with HHA, but feel imbalance may be worse due to weakness following surgery.  No current follow up PT planned and pt doesn't want to use assistive devices.  Will follow up to reinforce education and ensure safety with home entry with one step.     Follow Up Recommendations No PT follow up    Equipment Recommendations  None recommended by PT    Recommendations for Other Services       Precautions / Restrictions Precautions Precautions: Fall;Back Precaution Booklet Issued: Yes (comment) Required Braces or Orthoses: Spinal Brace Spinal Brace: Applied in sitting position      Mobility  Bed Mobility Overal bed mobility: Needs Assistance Bed Mobility: Rolling;Sidelying to Sit Rolling: Supervision Sidelying to sit: Supervision       General bed mobility comments: mod verbal cues for technique as pt getting up on her own not following precautions  Transfers Overall transfer level: Needs assistance Equipment used: None Transfers: Sit to/from Stand Sit to Stand: Min guard         General transfer comment: for safety/lines (foley, hemovac, IV)  Ambulation/Gait Ambulation/Gait assistance: Min assist Ambulation Distance (Feet): 400 Feet Assistive device: 1 person hand held assist Gait Pattern/deviations: Step-to pattern;Step-through pattern;Shuffle;Decreased stride length     General Gait Details: unsteady without some  external support, but not interested in assistive device  Stairs            Wheelchair Mobility    Modified Rankin (Stroke Patients Only)       Balance Overall balance assessment: Needs assistance   Sitting balance-Leahy Scale: Good     Standing balance support: No upper extremity supported;During functional activity Standing balance-Leahy Scale: Fair Standing balance comment: standing to hook her foley on her shorts under robe and to re-fasten brace                             Pertinent Vitals/Pain Pain Assessment: 0-10 Pain Score: 5  Pain Location: lumbar area Pain Descriptors / Indicators: Operative site guarding Pain Intervention(s): Monitored during session;Repositioned    Home Living Family/patient expects to be discharged to:: Private residence Living Arrangements: Spouse/significant other Available Help at Discharge: Family Type of Home: House Home Access: Stairs to enter   Secretary/administrator of Steps: 1 Home Layout: One level Home Equipment: None      Prior Function Level of Independence: Independent         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        Extremity/Trunk Assessment   Upper Extremity Assessment Upper Extremity Assessment: Defer to OT evaluation    Lower Extremity Assessment Lower Extremity Assessment: Generalized weakness    Cervical / Trunk Assessment Cervical / Trunk Assessment: Other exceptions Cervical / Trunk Exceptions: reports L hip higher and feels R foot turns out  Communication   Communication: No difficulties  Cognition Arousal/Alertness: Awake/alert Behavior During Therapy: WFL for tasks assessed/performed Overall  Cognitive Status: Within Functional Limits for tasks assessed                                        General Comments General comments (skin integrity, edema, etc.): pt instinctively bending to reach socks, cues for precautions and pt wore slip on  slippers instead; gave handout for precautions and reviewed all with pt; discussed car transfers as well    Exercises     Assessment/Plan    PT Assessment Patient needs continued PT services  PT Problem List Decreased strength;Decreased activity tolerance;Decreased balance;Decreased knowledge of precautions;Decreased mobility;Decreased safety awareness       PT Treatment Interventions DME instruction;Balance training;Gait training;Stair training;Functional mobility training;Patient/family education;Therapeutic activities    PT Goals (Current goals can be found in the Care Plan section)  Acute Rehab PT Goals Patient Stated Goal: To go home PT Goal Formulation: With patient Time For Goal Achievement: 10/03/17 Potential to Achieve Goals: Good    Frequency Min 5X/week   Barriers to discharge        Co-evaluation               AM-PAC PT "6 Clicks" Daily Activity  Outcome Measure Difficulty turning over in bed (including adjusting bedclothes, sheets and blankets)?: A Little Difficulty moving from lying on back to sitting on the side of the bed? : A Little Difficulty sitting down on and standing up from a chair with arms (e.g., wheelchair, bedside commode, etc,.)?: Unable Help needed moving to and from a bed to chair (including a wheelchair)?: A Little Help needed walking in hospital room?: A Little Help needed climbing 3-5 steps with a railing? : A Little 6 Click Score: 16    End of Session Equipment Utilized During Treatment: Back brace Activity Tolerance: Patient tolerated treatment well Patient left: in bed;with call bell/phone within reach   PT Visit Diagnosis: Other abnormalities of gait and mobility (R26.89)    Time: [id number] PT Time Calculation (min) (ACUTE ONLY): 24 min   Charges:   PT Evaluation $PT Eval Low Complexity: 1 Low PT Treatments $Gait Training: 8-22 mins   PT G CodesSheran Bowen:        Claudia Bowen, Claudia Bowen   Claudia Bowen Bowen,  10:40 AM

## 2017-10-01 MED ORDER — CYCLOBENZAPRINE HCL 10 MG PO TABS: 10.0000 mg | ORAL_TABLET | Freq: Three times a day (TID) | ORAL | 0 refills | Status: DC | PRN

## 2017-10-01 MED ORDER — OXYCODONE HCL 10 MG PO TABS: 10.0000 mg | ORAL_TABLET | ORAL | 0 refills | Status: DC | PRN

## 2017-10-01 NOTE — Progress Notes (Signed)
Subjective: Patient reports patient well no leg pain minimal back pain  Objective: Vital signs in last 24 hours: Temp:  [98 F (36.7 C)-98.5 F (36.9 C)] 98.3 F (36.8 C) (01/02 0555) Pulse Rate:  [88-95] 95 (01/02 0555) Resp:  [18] 18 (01/02 0555) BP: (115-136)/(59-89) 115/64 (01/02 0555) SpO2:  [99 %-100 %] 99 % (01/02 0555)  Intake/Output from previous day: 01/01 0701 - 01/02 0700 In: 540 [P.O.:240; IV Piggyback:300] Out: 1210 [Urine:1150; Drains:60] Intake/Output this shift: Total I/O In: -  Out: 50 [Drains:50]  awake alert strength 5 out of 5 wound clean dry and intact.  Lab Results: No results for input(s): WBC, HGB, HCT, PLT in the last 72 hours. BMET No results for input(s): NA, K, CL, CO2, GLUCOSE, BUN, CREATININE, CALCIUM in the last 72 hours.  Studies/Results: Dg Lumbar Spine 2-3 Views  Result Date: 09/29/2017 CLINICAL DATA:  L2-4 PLIF EXAM: LUMBAR SPINE - 2-3 VIEW; DG C-ARM 61-120 MIN COMPARISON:  MRI lumbar spine dated 07/20/2017 FINDINGS: Intraoperative fluoroscopic radiographs during L2-4 PLIF. Pedicle screws in satisfactory position. IMPRESSION: Intraoperative fluoroscopic radiographs during L2-4 PLIF, as above. Electronically Signed   By: Charline BillsSriyesh  Krishnan M.D.   On: 09/29/2017 11:38   Dg C-arm 61-120 Min  Result Date: 09/29/2017 CLINICAL DATA:  L2-4 PLIF EXAM: LUMBAR SPINE - 2-3 VIEW; DG C-ARM 61-120 MIN COMPARISON:  MRI lumbar spine dated 07/20/2017 FINDINGS: Intraoperative fluoroscopic radiographs during L2-4 PLIF. Pedicle screws in satisfactory position. IMPRESSION: Intraoperative fluoroscopic radiographs during L2-4 PLIF, as above. Electronically Signed   By: Charline BillsSriyesh  Krishnan M.D.   On: 09/29/2017 11:38    Assessment/Plan: Discharge Hemovac discharge home scheduled follow-up 12 weeks  LOS: 2 days    Emanual Lamountain P 10/01/2017, 8:21 AM

## 2017-10-01 NOTE — Care Management Note (Signed)
Case Management Note  Patient Details  Name: Claudia Bowen MRN: [id number] Date of Birth: 1955/12/22  Subjective/Objective:    Pt s/p lumbar fusion. She is from home with her spouse.                Action/Plan: Pt discharged home with self care. No f/u per PT/OT and no DME needs. Pt has PCP, insurance and transportation home.   Expected Discharge Date:  10/01/17               Expected Discharge Plan:  Home/Self Care  In-House Referral:     Discharge planning Services     Post Acute Care Choice:    Choice offered to:     DME Arranged:    DME Agency:     HH Arranged:    HH Agency:     Status of Service:  Completed, signed off  If discussed at [employer] Length of Stay Meetings, dates discussed:    Additional Comments:  Kermit BaloKelli F Malisa Ruggiero, RN 10/01/2017, 1:31 PM

## 2017-10-01 NOTE — Progress Notes (Signed)
Discharge instructions given. Pt being discharged with Foley for neurogenic bladder. Pt taken down by volunteers with all of belongings.

## 2017-10-01 NOTE — Progress Notes (Signed)
Physical Therapy Treatment Patient Details Name: Claudia NakayamaRicki L Bowen MRN: [id number] DOB: 05/08/1956 Today's Date: 10/01/2017    History of Present Illness Patient is a 62 y/o female admitted with Lumbar spinal stenosis instability degenerative disc disease L2-3 L3-4 above a previous L4-S1 fusion. With bilateral L3 and L4 radiculopathies, now s/p TLIF L2-3, L 3-4 with foraminotomies of the L2, L3, L4 nerve roots.    PT Comments    Patient progressing per report and able to demonstrate bed mobility with mod I.  States walked several more times yesterday and has been up with OT already this am.  Currently dressed and stable for d/c home.  No further skilled PT needs at d/c.  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    Follow Up Recommendations  No PT follow up     Equipment Recommendations  None recommended by PT    Recommendations for Other Services       Precautions / Restrictions Precautions Precautions: Fall;Back Precaution Booklet Issued: (Given prior session) Precaution Comments: Pt able to verbalize precautions. Needs Min VCs to adhere during session Required Braces or Orthoses: Spinal Brace Spinal Brace: Applied in sitting position Restrictions Weight Bearing Restrictions: No    Mobility  Bed Mobility Overal bed mobility: Modified Independent Bed Mobility: Rolling;Sidelying to Sit Rolling: Supervision Sidelying to sit: Supervision       General bed mobility comments: able to demonstrate safely to flat bed and discussed how to perform at home without rails  Transfers Overall transfer level: Needs assistance Equipment used: None Transfers: Sit to/from Stand Sit to Stand: Supervision         General transfer comment: supervision for safety  Ambulation/Gait             General Gait Details: deferred due to RN in room and pt reports for d/c today and already walked 2 more  times yesterday and this morning with OT   Stairs            Wheelchair Mobility    Modified Rankin (Stroke Patients Only)       Balance Overall balance assessment: Needs assistance Sitting-balance support: Feet unsupported;No upper extremity supported Sitting balance-Leahy Scale: Good Sitting balance - Comments: RN raised bed to change dressing and no foot support, but pt able to balance unaided   Standing balance support: No upper extremity supported;During functional activity Standing balance-Leahy Scale: Good                              Cognition Arousal/Alertness: Awake/alert Behavior During Therapy: WFL for tasks assessed/performed Overall Cognitive Status: Within Functional Limits for tasks assessed                                        Exercises      General Comments General comments (skin integrity, edema, etc.): reviewed sleeping positions, car transfers and precautions with all mobility; also educated/demonstrated how to negotiate one step for entry with spouse support      Pertinent Vitals/Pain Pain Assessment: Faces Faces Pain Scale: Hurts even more Pain Location: lumbar area Pain Descriptors / Indicators: Operative site guarding;Sore Pain Intervention(s): Monitored during session    Home Living  Prior Function            PT Goals (current goals can now be found in the care plan section) Acute Rehab PT Goals Patient Stated Goal: To go home Progress towards PT goals: Progressing toward goals    Frequency    Min 5X/week      PT Plan      Co-evaluation              AM-PAC PT "6 Clicks" Daily Activity  Outcome Measure  Difficulty turning over in bed (including adjusting bedclothes, sheets and blankets)?: None Difficulty moving from lying on back to sitting on the side of the bed? : None Difficulty sitting down on and standing up from a chair with arms (e.g.,  wheelchair, bedside commode, etc,.)?: A Little Help needed moving to and from a bed to chair (including a wheelchair)?: A Little Help needed walking in hospital room?: A Little Help needed climbing 3-5 steps with a railing? : A Little 6 Click Score: 20    End of Session   Activity Tolerance: Patient tolerated treatment well Patient left: in bed;with call bell/phone within reach;with nursing/sitter in room   PT Visit Diagnosis: Other abnormalities of gait and mobility (R26.89)     Time: 1610-9604 PT Time Calculation (min) (ACUTE ONLY): 10 min  Charges:  $Therapeutic Activity: 8-22 mins                    G CodesSheran Bowen, Claudia Bowen [phone] 10/01/2017    Claudia Bowen 10/01/2017, 10:39 AM

## 2017-10-01 NOTE — Discharge Summary (Signed)
Physician Discharge Summary  Patient ID: Claudia Bowen MRN: [id number] DOB/AGE: 1956/06/17 62 y.o.  Admit date: 09/29/2017 Discharge date: 10/01/2017  Admission Diagnoses: Lumbar spinal stenosis instability degenerative disc disease L2-3 L3-4 above a previous L4-S1 fusion   Discharge Diagnoses: same as admitting  Discharged Condition: good  Hospital Course: The patient was admitted on 09/29/2017 and taken to the operating room where the patient underwent PLIF L2-3, L3-4. The patient tolerated the procedure well and was taken to the recovery room and then to the floor in stable condition. The hospital course was routine. There were no complications. The wound remained clean dry and intact. Pt had appropriate back soreness. No complaints of new pain or new N/T/W. The patient remained afebrile with stable vital signs, and tolerated a regular diet. The patient continued to increase activities, and pain was well controlled with oral pain medications.   Consults: None  Significant Diagnostic Studies:  Results for orders placed or performed during the hospital encounter of 09/18/17  Surgical pcr screen  Result Value Ref Range   MRSA, PCR NEGATIVE NEGATIVE   Staphylococcus aureus POSITIVE (A) NEGATIVE  CBC  Result Value Ref Range   WBC 3.9 (L) 4.0 - 10.5 K/uL   RBC 4.72 3.87 - 5.11 MIL/uL   Hemoglobin 14.2 12.0 - 15.0 g/dL   HCT 40.9 81.1 - 91.4 %   MCV 89.6 78.0 - 100.0 fL   MCH 30.1 26.0 - 34.0 pg   MCHC 33.6 30.0 - 36.0 g/dL   RDW 78.2 95.6 - 21.3 %   Platelets 237 150 - 400 K/uL  Basic metabolic panel  Result Value Ref Range   Sodium 139 135 - 145 mmol/L   Potassium 4.3 3.5 - 5.1 mmol/L   Chloride 103 101 - 111 mmol/L   CO2 29 22 - 32 mmol/L   Glucose, Bld 107 (H) 65 - 99 mg/dL   BUN 17 6 - 20 mg/dL   Creatinine, Ser 0.86 0.44 - 1.00 mg/dL   Calcium 9.6 8.9 - 57.8 mg/dL   GFR calc non Af Amer >60 >60 mL/min   GFR calc Af Amer >60 >60 mL/min   Anion gap 7 5 - 15  Type and  screen [facility]  Result Value Ref Range   ABO/RH(D) B POS    Antibody Screen NEG    Sample Expiration 10/02/2017    Extend sample reason NO TRANSFUSIONS OR PREGNANCY IN THE PAST 3 MONTHS   ABO/Rh  Result Value Ref Range   ABO/RH(D) B POS     Dg Lumbar Spine 2-3 Views  Result Date: 09/29/2017 CLINICAL DATA:  L2-4 PLIF EXAM: LUMBAR SPINE - 2-3 VIEW; DG C-ARM 61-120 MIN COMPARISON:  MRI lumbar spine dated 07/20/2017 FINDINGS: Intraoperative fluoroscopic radiographs during L2-4 PLIF. Pedicle screws in satisfactory position. IMPRESSION: Intraoperative fluoroscopic radiographs during L2-4 PLIF, as above. Electronically Signed   By: Charline Bills M.D.   On: 09/29/2017 11:38   Dg C-arm 61-120 Min  Result Date: 09/29/2017 CLINICAL DATA:  L2-4 PLIF EXAM: LUMBAR SPINE - 2-3 VIEW; DG C-ARM 61-120 MIN COMPARISON:  MRI lumbar spine dated 07/20/2017 FINDINGS: Intraoperative fluoroscopic radiographs during L2-4 PLIF. Pedicle screws in satisfactory position. IMPRESSION: Intraoperative fluoroscopic radiographs during L2-4 PLIF, as above. Electronically Signed   By: Charline Bills M.D.   On: 09/29/2017 11:38    Antibiotics:  Anti-infectives (From admission, onward)   Start     Dose/Rate Route Frequency Ordered Stop   09/29/17 1800  ceFAZolin (ANCEF)  IVPB 2g/100 mL premix     2 g 200 mL/hr over 30 Minutes Intravenous Every 8 hours 09/29/17 1221 10/01/17 1759   09/29/17 1145  ceFAZolin (ANCEF) IVPB 2g/100 mL premix     2 g 200 mL/hr over 30 Minutes Intravenous  Once 09/29/17 1136 09/29/17 1139   09/29/17 1111  vancomycin (VANCOCIN) powder  Status:  Discontinued       As needed 09/29/17 1112 09/29/17 1204   09/29/17 0720  bacitracin 50,000 Units in sodium chloride irrigation 0.9 % 500 mL irrigation  Status:  Discontinued       As needed 09/29/17 0846 09/29/17 1204   09/29/17 0606  ceFAZolin (ANCEF) 2-4 GM/100ML-% IVPB    Comments:  Hazlip, Jessica   : cabinet override       09/29/17 0606 09/29/17 0731   09/29/17 0556  ceFAZolin (ANCEF) IVPB 2g/100 mL premix     2 g 200 mL/hr over 30 Minutes Intravenous On call to O.R. 09/29/17 0556 09/29/17 0731      Discharge Exam: Blood pressure 115/64, pulse 95, temperature 98.3 F (36.8 C), temperature source Oral, resp. rate 18, weight 57.2 kg (126 lb), SpO2 99 %. Neurologic: Grossly normal Ambulating, going home with urinary catheter  Discharge Medications:   Allergies as of 10/01/2017      Reactions   Sulfa Antibiotics Anaphylaxis   Levofloxacin Hives   Gabapentin Nausea Only   UNSPECIFIED REACTION    Cyclobenzaprine Other (See Comments)   Dizziness      Medication List    TAKE these medications   cyclobenzaprine 10 MG tablet Commonly known as:  FLEXERIL Take 1 tablet (10 mg total) by mouth 3 (three) times daily as needed for muscle spasms.   divalproex 125 MG capsule Commonly known as:  DEPAKOTE SPRINKLE Take 125 mg by mouth 2 (two) times daily.   docusate sodium 100 MG capsule Commonly known as:  COLACE Take 100 mg by mouth daily as needed for mild constipation.   naproxen sodium 220 MG tablet Commonly known as:  ALEVE Take 220 mg by mouth every 12 (twelve) hours.   ondansetron 4 MG disintegrating tablet Commonly known as:  ZOFRAN-ODT Take 4 mg by mouth every 8 (eight) hours as needed for nausea or vomiting.   Oxycodone HCl 10 MG Tabs Take 1 tablet (10 mg total) by mouth every 3 (three) hours as needed for severe pain ((score 7 to 10)).   pantoprazole 40 MG tablet Commonly known as:  PROTONIX Take 40 mg by mouth daily.   rizatriptan 10 MG tablet Commonly known as:  MAXALT Take 3.33 mg by mouth as needed for migraine.   SUMAtriptan 50 MG tablet Commonly known as:  IMITREX Take 25 mg by mouth every 2 (two) hours as needed for migraine. May repeat in 2 hours if headache persists or recurs.       Disposition: home   Final Dx: same as admitting  Discharge Instructions    Call MD  for:  difficulty breathing, headache or visual disturbances   Complete by:  As directed    Call MD for:  extreme fatigue   Complete by:  As directed    Call MD for:  hives   Complete by:  As directed    Call MD for:  persistant dizziness or light-headedness   Complete by:  As directed    Call MD for:  persistant nausea and vomiting   Complete by:  As directed    Call MD for:  redness, tenderness, or signs of infection (pain, swelling, redness, odor or green/yellow discharge around incision site)   Complete by:  As directed    Call MD for:  severe uncontrolled pain   Complete by:  As directed    Call MD for:  temperature >100.4   Complete by:  As directed    Diet - low sodium heart healthy   Complete by:  As directed    Driving Restrictions   Complete by:  As directed    No driving 2 weeks   Increase activity slowly   Complete by:  As directed    Lifting restrictions   Complete by:  As directed    Nothing heavier than 8 lbs         Signed: Tiana Loft Jalen Oberry 10/01/2017, 8:20 AM

## 2017-10-01 NOTE — Progress Notes (Signed)
Occupational Therapy Treatment Patient Details Name: Claudia Bowen MRN: [id number] DOB: Sep 18, 1956 Today's Date: 10/01/2017    History of present illness Patient is a 62 y/o female admitted with Lumbar spinal stenosis instability degenerative disc disease L2-3 L3-4 above a previous L4-S1 fusion. With bilateral L3 and L4 radiculopathies, now s/p TLIF L2-3, L 3-4 with foraminotomies of the L2, L3, L4 nerve roots.   OT comments  Pt progressing towards established goals. Pt performing dressing with supervision and AE as needed; Min VCs for safety and adherence to precautions. Continue to recommend dc home once medically stable. Answering all pt's questions and providing education. All acute OT needs met and will sign off.   Follow Up Recommendations  No OT follow up;Supervision/Assistance - 24 hour    Equipment Recommendations  None recommended by OT    Recommendations for Other Services PT consult    Precautions / Restrictions Precautions Precautions: Fall;Back Precaution Booklet Issued: (Given prior session) Precaution Comments: Pt able to verbalize precautions. Needs Min VCs to adhere during session Required Braces or Orthoses: Spinal Brace Spinal Brace: Applied in sitting position Restrictions Weight Bearing Restrictions: No       Mobility Bed Mobility Overal bed mobility: Needs Assistance Bed Mobility: Rolling;Sidelying to Sit Rolling: Supervision Sidelying to sit: Supervision       General bed mobility comments: supervision for safety  Transfers Overall transfer level: Needs assistance Equipment used: None Transfers: Sit to/from Stand Sit to Stand: Supervision         General transfer comment: supervision for safety    Balance Overall balance assessment: Needs assistance Sitting-balance support: No upper extremity supported;Feet supported Sitting balance-Leahy Scale: Good     Standing balance support: No upper extremity supported;During functional  activity Standing balance-Leahy Scale: Good                             ADL either performed or assessed with clinical judgement   ADL Overall ADL's : Needs assistance/impaired                 Upper Body Dressing : Standing;Set up;Supervision/safety Upper Body Dressing Details (indicate cue type and reason): Donned shirt and brace with supervision for safety and Min VCs not to twist Lower Body Dressing: Moderate assistance;Sit to/from stand Lower Body Dressing Details (indicate cue type and reason): Pt donning pants with reacher demonstrating good understanding and adherance to precautions             Functional mobility during ADLs: Supervision/safety General ADL Comments: Pt demonstrating uncerstanding of education and back precautions. Required Min VCs throughout session for adherance to precautions (particularly not to bend).     Vision       Perception     Praxis      Cognition Arousal/Alertness: Awake/alert Behavior During Therapy: WFL for tasks assessed/performed Overall Cognitive Status: Within Functional Limits for tasks assessed                                          Exercises     Shoulder Instructions       General Comments      Pertinent Vitals/ Pain       Pain Assessment: Faces Faces Pain Scale: Hurts even more Pain Location: lumbar area Pain Descriptors / Indicators: Operative site guarding;Sore Pain Intervention(s): Monitored during session;Limited activity within patient's  tolerance;Repositioned;RN gave pain meds during session  Home Living                                          Prior Functioning/Environment              Frequency  Min 2X/week        Progress Toward Goals  OT Goals(current goals can now be found in the care plan section)  Progress towards OT goals: Progressing toward goals  Acute Rehab OT Goals Patient Stated Goal: To go home OT Goal Formulation: All  assessment and education complete, DC therapy Time For Goal Achievement: 10/14/17 Potential to Achieve Goals: Good ADL Goals Pt Will Perform Lower Body Dressing: with modified independence;with adaptive equipment;sit to/from stand Additional ADL Goal #1: Pt will independently verbalize 3/3 back precautions  Plan Discharge plan remains appropriate;All goals met and education completed, patient discharged from OT services    Co-evaluation                 AM-PAC PT "6 Clicks" Daily Activity     Outcome Measure   Help from another person eating meals?: None Help from another person taking care of personal grooming?: None Help from another person toileting, which includes using toliet, bedpan, or urinal?: None Help from another person bathing (including washing, rinsing, drying)?: A Little Help from another person to put on and taking off regular upper body clothing?: A Little Help from another person to put on and taking off regular lower body clothing?: A Little 6 Click Score: 21    End of Session Equipment Utilized During Treatment: Back brace  OT Visit Diagnosis: Unsteadiness on feet (R26.81);Other abnormalities of gait and mobility (R26.89);Muscle weakness (generalized) (M62.81);Pain Pain - part of body: (Back)   Activity Tolerance Patient tolerated treatment well   Patient Left with call bell/phone within reach;in chair   Nurse Communication Mobility status        Time: 0740-0801 OT Time Calculation (min): 21 min  Charges: OT General Charges $OT Visit: 1 Visit OT Treatments $Self Care/Home Management : 8-22 mins  Charis Capehart MSOT, OTR/L Acute Rehab Pager: [phone] Office: [phone]    Charis M Capehart 10/01/2017, 8:11 AM    

## 2017-10-03 ENCOUNTER — Encounter (HOSPITAL_COMMUNITY): Payer: Self-pay | Admitting: Neurosurgery

## 2017-10-03 MED FILL — Heparin Sodium (Porcine) Inj 1000 Unit/ML: INTRAMUSCULAR | Qty: 30 | Status: AC

## 2017-10-03 MED FILL — Sodium Chloride IV Soln 0.9%: INTRAVENOUS | Qty: 1000 | Status: AC

## 2017-10-03 NOTE — OR Nursing (Signed)
NOTE MADE TO CORRECT IMPLANT SECTION FOR VIVIGEN

## 2017-11-04 DIAGNOSIS — M5440 Lumbago with sciatica, unspecified side: Secondary | ICD-10-CM | POA: Diagnosis not present

## 2017-11-14 DIAGNOSIS — M545 Low back pain: Secondary | ICD-10-CM | POA: Diagnosis not present

## 2017-11-14 DIAGNOSIS — G8929 Other chronic pain: Secondary | ICD-10-CM | POA: Diagnosis not present

## 2017-11-19 DIAGNOSIS — M545 Low back pain: Secondary | ICD-10-CM | POA: Diagnosis not present

## 2017-11-19 DIAGNOSIS — G8929 Other chronic pain: Secondary | ICD-10-CM | POA: Diagnosis not present

## 2017-11-19 DIAGNOSIS — M6281 Muscle weakness (generalized): Secondary | ICD-10-CM | POA: Diagnosis not present

## 2017-11-26 DIAGNOSIS — Z4789 Encounter for other orthopedic aftercare: Secondary | ICD-10-CM | POA: Diagnosis not present

## 2017-11-26 DIAGNOSIS — Z981 Arthrodesis status: Secondary | ICD-10-CM | POA: Diagnosis not present

## 2017-12-02 DIAGNOSIS — M5440 Lumbago with sciatica, unspecified side: Secondary | ICD-10-CM | POA: Diagnosis not present

## 2017-12-09 DIAGNOSIS — Z4789 Encounter for other orthopedic aftercare: Secondary | ICD-10-CM | POA: Diagnosis not present

## 2017-12-09 DIAGNOSIS — Z981 Arthrodesis status: Secondary | ICD-10-CM | POA: Diagnosis not present

## 2017-12-23 DIAGNOSIS — Z4789 Encounter for other orthopedic aftercare: Secondary | ICD-10-CM | POA: Diagnosis not present

## 2018-01-06 DIAGNOSIS — M5440 Lumbago with sciatica, unspecified side: Secondary | ICD-10-CM | POA: Diagnosis not present

## 2018-01-14 DIAGNOSIS — G43109 Migraine with aura, not intractable, without status migrainosus: Secondary | ICD-10-CM | POA: Diagnosis not present

## 2018-01-14 DIAGNOSIS — R2681 Unsteadiness on feet: Secondary | ICD-10-CM | POA: Diagnosis not present

## 2018-02-20 DIAGNOSIS — G43109 Migraine with aura, not intractable, without status migrainosus: Secondary | ICD-10-CM | POA: Diagnosis not present

## 2018-02-20 DIAGNOSIS — R2681 Unsteadiness on feet: Secondary | ICD-10-CM | POA: Diagnosis not present

## 2018-03-12 DIAGNOSIS — M5440 Lumbago with sciatica, unspecified side: Secondary | ICD-10-CM | POA: Diagnosis not present

## 2018-03-26 DIAGNOSIS — M461 Sacroiliitis, not elsewhere classified: Secondary | ICD-10-CM | POA: Diagnosis not present

## 2018-03-26 DIAGNOSIS — M419 Scoliosis, unspecified: Secondary | ICD-10-CM | POA: Diagnosis not present

## 2018-04-22 DIAGNOSIS — G8929 Other chronic pain: Secondary | ICD-10-CM | POA: Diagnosis not present

## 2018-04-22 DIAGNOSIS — M6281 Muscle weakness (generalized): Secondary | ICD-10-CM | POA: Diagnosis not present

## 2018-04-22 DIAGNOSIS — M545 Low back pain: Secondary | ICD-10-CM | POA: Diagnosis not present

## 2018-04-27 DIAGNOSIS — M533 Sacrococcygeal disorders, not elsewhere classified: Secondary | ICD-10-CM | POA: Diagnosis not present

## 2018-04-30 DIAGNOSIS — M6281 Muscle weakness (generalized): Secondary | ICD-10-CM | POA: Diagnosis not present

## 2018-04-30 DIAGNOSIS — G8929 Other chronic pain: Secondary | ICD-10-CM | POA: Diagnosis not present

## 2018-04-30 DIAGNOSIS — M545 Low back pain: Secondary | ICD-10-CM | POA: Diagnosis not present

## 2018-05-19 DIAGNOSIS — G8929 Other chronic pain: Secondary | ICD-10-CM | POA: Diagnosis not present

## 2018-05-19 DIAGNOSIS — M419 Scoliosis, unspecified: Secondary | ICD-10-CM | POA: Diagnosis not present

## 2018-05-19 DIAGNOSIS — M545 Low back pain: Secondary | ICD-10-CM | POA: Diagnosis not present

## 2018-05-19 DIAGNOSIS — M6281 Muscle weakness (generalized): Secondary | ICD-10-CM | POA: Diagnosis not present

## 2018-07-08 DIAGNOSIS — K5909 Other constipation: Secondary | ICD-10-CM | POA: Diagnosis not present

## 2018-07-22 DIAGNOSIS — M461 Sacroiliitis, not elsewhere classified: Secondary | ICD-10-CM | POA: Diagnosis not present

## 2018-07-22 DIAGNOSIS — M961 Postlaminectomy syndrome, not elsewhere classified: Secondary | ICD-10-CM | POA: Diagnosis not present

## 2018-08-17 DIAGNOSIS — G43109 Migraine with aura, not intractable, without status migrainosus: Secondary | ICD-10-CM | POA: Diagnosis not present

## 2018-08-17 DIAGNOSIS — R2681 Unsteadiness on feet: Secondary | ICD-10-CM | POA: Diagnosis not present

## 2018-08-17 DIAGNOSIS — G251 Drug-induced tremor: Secondary | ICD-10-CM | POA: Diagnosis not present

## 2018-09-08 DIAGNOSIS — M533 Sacrococcygeal disorders, not elsewhere classified: Secondary | ICD-10-CM | POA: Diagnosis not present

## 2018-09-08 DIAGNOSIS — M5416 Radiculopathy, lumbar region: Secondary | ICD-10-CM | POA: Diagnosis not present

## 2018-09-14 DIAGNOSIS — M5416 Radiculopathy, lumbar region: Secondary | ICD-10-CM | POA: Diagnosis not present

## 2018-09-22 DIAGNOSIS — N3000 Acute cystitis without hematuria: Secondary | ICD-10-CM | POA: Diagnosis not present

## 2018-09-22 DIAGNOSIS — R300 Dysuria: Secondary | ICD-10-CM | POA: Diagnosis not present

## 2018-11-19 DIAGNOSIS — R030 Elevated blood-pressure reading, without diagnosis of hypertension: Secondary | ICD-10-CM | POA: Diagnosis not present

## 2018-11-19 DIAGNOSIS — M5440 Lumbago with sciatica, unspecified side: Secondary | ICD-10-CM | POA: Diagnosis not present

## 2018-11-23 ENCOUNTER — Other Ambulatory Visit: Payer: Self-pay | Admitting: Neurosurgery

## 2018-11-23 DIAGNOSIS — M5440 Lumbago with sciatica, unspecified side: Secondary | ICD-10-CM

## 2018-12-04 ENCOUNTER — Ambulatory Visit
Admission: RE | Admit: 2018-12-04 | Discharge: 2018-12-04 | Disposition: A | Discharge status: Home / Self Care | Payer: Federal, State, Local not specified - PPO | Source: Ambulatory Visit | Attending: Neurosurgery | Admitting: Neurosurgery

## 2018-12-04 DIAGNOSIS — M5440 Lumbago with sciatica, unspecified side: Secondary | ICD-10-CM

## 2018-12-04 DIAGNOSIS — M545 Low back pain: Secondary | ICD-10-CM | POA: Diagnosis not present

## 2018-12-07 DIAGNOSIS — G43109 Migraine with aura, not intractable, without status migrainosus: Secondary | ICD-10-CM | POA: Diagnosis not present

## 2018-12-07 DIAGNOSIS — G251 Drug-induced tremor: Secondary | ICD-10-CM | POA: Diagnosis not present

## 2018-12-08 DIAGNOSIS — M5440 Lumbago with sciatica, unspecified side: Secondary | ICD-10-CM | POA: Diagnosis not present

## 2019-03-08 DIAGNOSIS — G43109 Migraine with aura, not intractable, without status migrainosus: Secondary | ICD-10-CM | POA: Diagnosis not present

## 2019-03-08 DIAGNOSIS — H9313 Tinnitus, bilateral: Secondary | ICD-10-CM | POA: Diagnosis not present

## 2019-03-08 DIAGNOSIS — G251 Drug-induced tremor: Secondary | ICD-10-CM | POA: Diagnosis not present

## 2019-03-09 DIAGNOSIS — M5416 Radiculopathy, lumbar region: Secondary | ICD-10-CM | POA: Diagnosis not present

## 2019-03-10 DIAGNOSIS — G43109 Migraine with aura, not intractable, without status migrainosus: Secondary | ICD-10-CM | POA: Diagnosis not present

## 2019-04-06 DIAGNOSIS — G8929 Other chronic pain: Secondary | ICD-10-CM | POA: Diagnosis not present

## 2019-04-06 DIAGNOSIS — M5416 Radiculopathy, lumbar region: Secondary | ICD-10-CM | POA: Diagnosis not present

## 2019-04-06 DIAGNOSIS — M5440 Lumbago with sciatica, unspecified side: Secondary | ICD-10-CM | POA: Diagnosis not present

## 2019-04-06 DIAGNOSIS — M4802 Spinal stenosis, cervical region: Secondary | ICD-10-CM | POA: Diagnosis not present

## 2019-05-06 IMAGING — CT CT ABD-PELV W/ CM
1 of 4 series · 11 of 32 positions shown, 17 images · IV contrast (APPLIED)
Comparison: MRI lumbar spine 07/20/2017 and 03/06/2016.

CLINICAL DATA: Patient for lumbar spine surgery. Preoperative
evaluation for possible screw placement in the pelvis.

EXAM:
CT ABDOMEN AND PELVIS WITH CONTRAST
TECHNIQUE: Multidetector CT imaging of the abdomen and pelvis was performed
using the standard protocol following bolus administration of
intravenous contrast.
CONTRAST:  100 ml YPRXNL-044 IOPAMIDOL (YPRXNL-044) INJECTION 61%

[Series 2: abd/pelvis w/cm · axial · 0.70mm/px · z∈[-443,-83]mm · 11 of 88 slices shown, 17 images]
[im 8/88  soft-tissue]
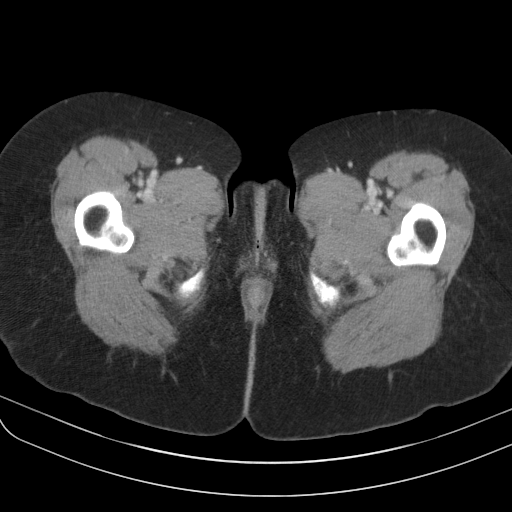
[im 8/88  bone]
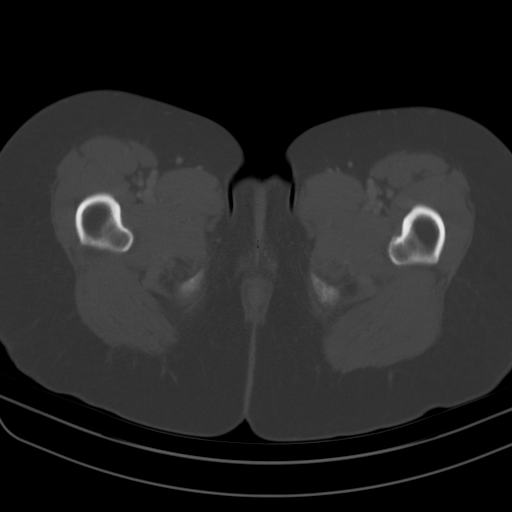
[im 15/88  soft-tissue]
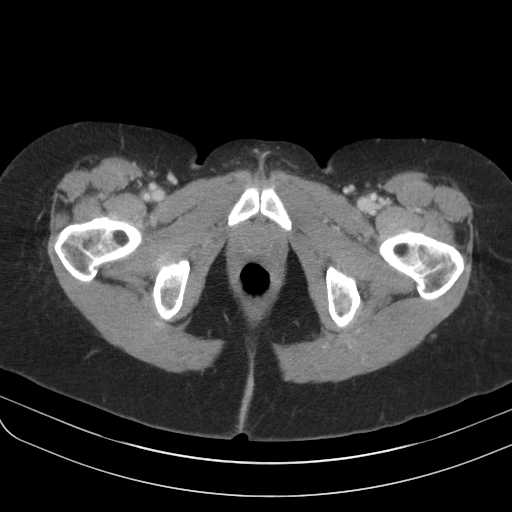
[im 22/88  soft-tissue]
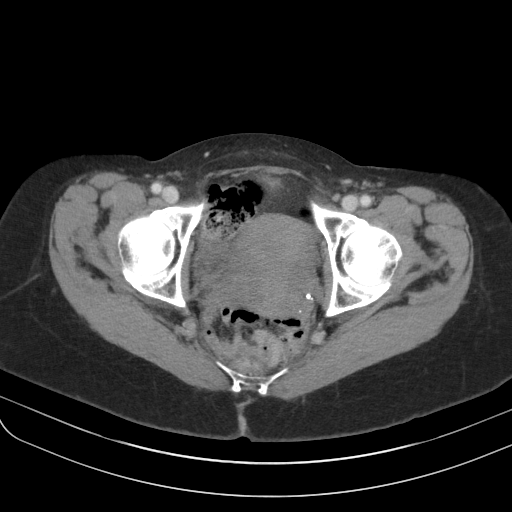
[im 30/88  soft-tissue]
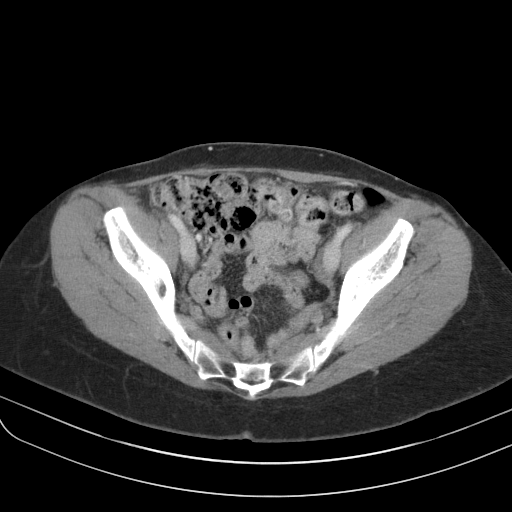
[im 37/88  soft-tissue]
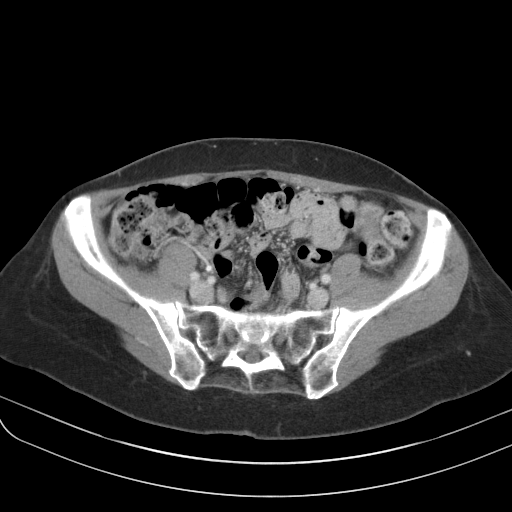
[im 44/88  soft-tissue]
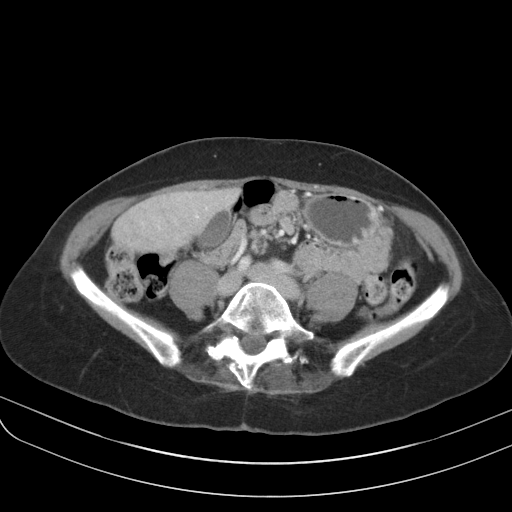
[im 51/88  soft-tissue]
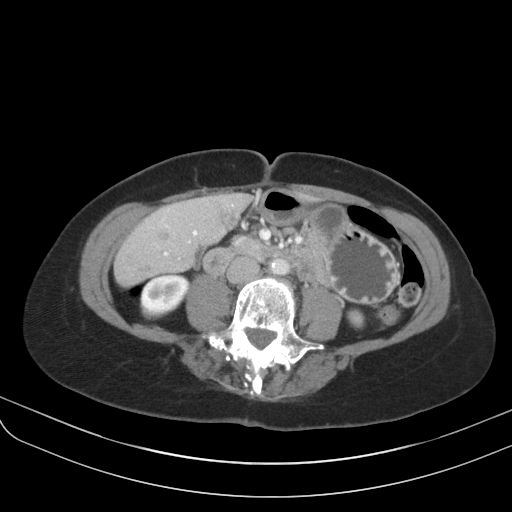
[im 59/88  soft-tissue]
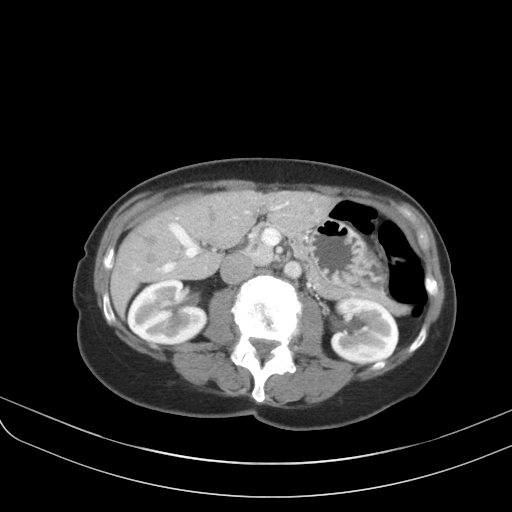
[im 59/88  lung]
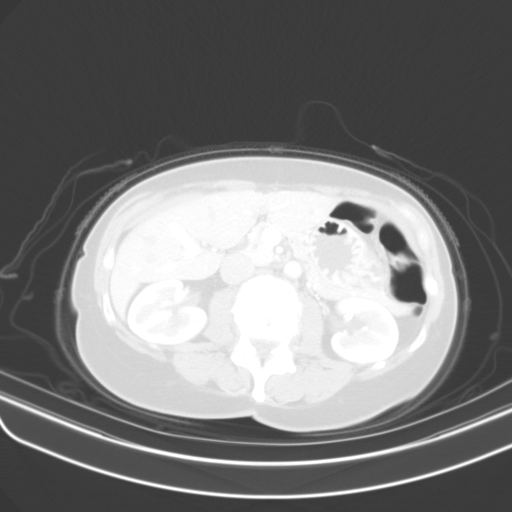
[im 66/88  soft-tissue]
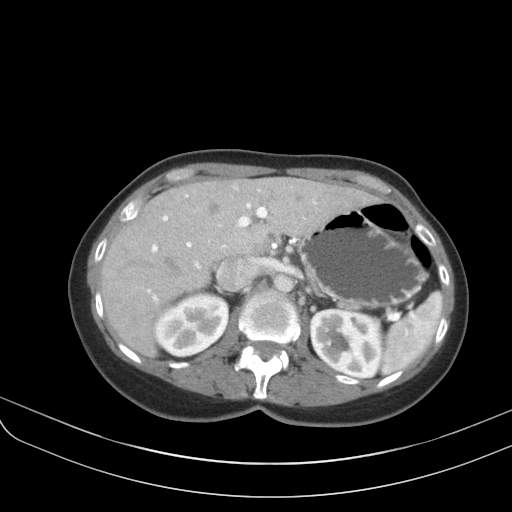
[im 66/88  lung]
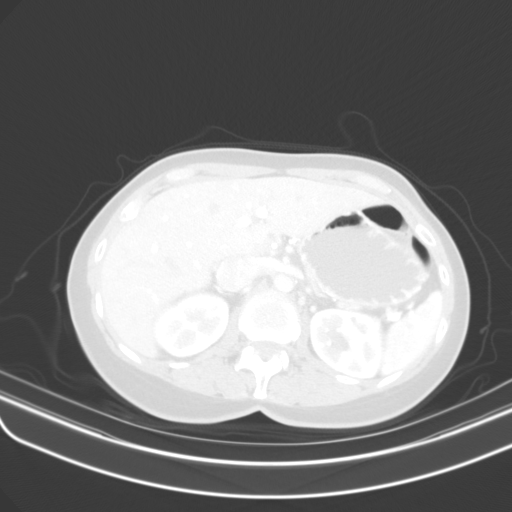
[im 66/88  bone]
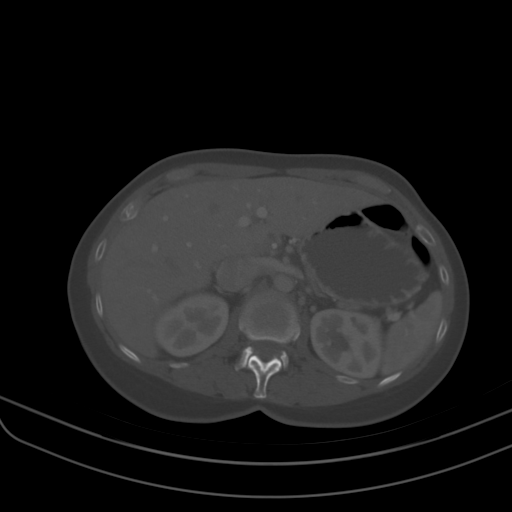
[im 73/88  soft-tissue]
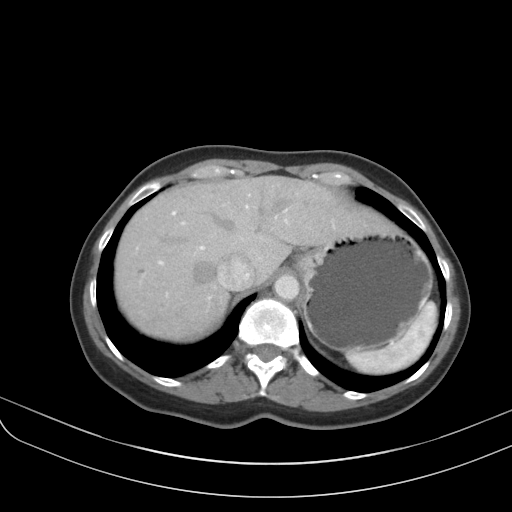
[im 73/88  lung]
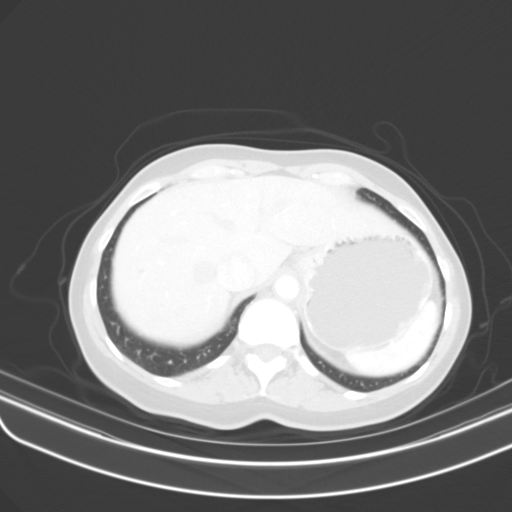
[im 80/88  soft-tissue]
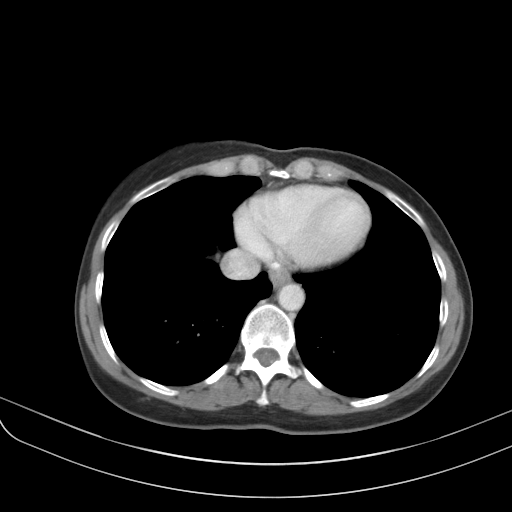
[im 80/88  lung]
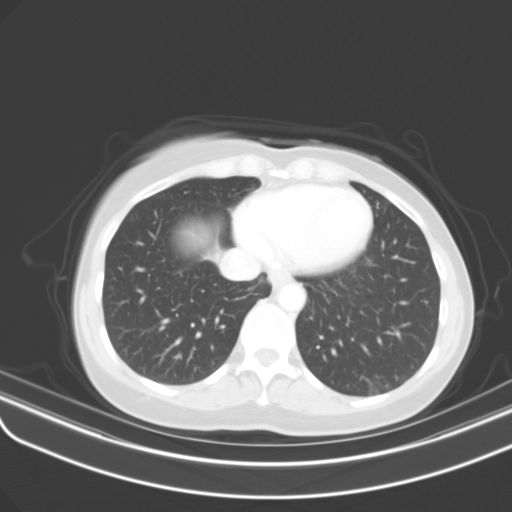

[11 of 32 positions shown; findings below may reference images not displayed]

FINDINGS: Lower chest: Lung bases are clear. No pleural or pericardial
effusion.

Hepatobiliary: Scattered punctate hypoattenuating lesions in the
liver are likely cysts. The liver is otherwise unremarkable. The
gallbladder and biliary tree appear normal.

Pancreas: Unremarkable. No pancreatic ductal dilatation or
surrounding inflammatory changes.

Spleen: Normal in size without focal abnormality.

Adrenals/Urinary Tract: A few small hypoattenuating lesions in the
kidneys are likely cysts. The kidneys otherwise appear normal. The
urinary bladder is decompressed but otherwise unremarkable. The
adrenal glands appear normal.

Stomach/Bowel: The stomach and small and large bowel appear normal.
The appendix is not discretely visualized but no evidence of
appendicitis is seen.

Vascular/Lymphatic: No significant vascular findings are present. No
enlarged abdominal or pelvic lymph nodes.

Reproductive: Uterus and bilateral adnexa are unremarkable.

Other: No fluid collection or hernia.

Musculoskeletal: The patient is status post L4-S1 fusion. Loss of
disc space height is seen at L2-3 and L3-4 loss of disc space height
is worse at L2-3 where there is associated endplate spurring. There
is solid bridging bone across the disc interspaces at both levels
and the facet joints are solidly fused on the right at both levels.
A small amount of bridging bone is seen across the left L4-5 facets
and possibly the left L5-S1 facets. The pelvis is intact and normal
in appearance. No lytic or sclerotic lesion in the pelvis is
identified. Sacroiliac joints and symphysis pubis appear normal.
Minimal degenerative change about the hips is noted.
IMPRESSION: No acute finding.

Status post L4-S1 fusion with solid bridging bone across the disc
interspaces at both levels and posterior fusion mass on the right.
Small amount of bridging bone between the left L4-5 and L5-S1 facets
is also identified.

Normal appearing bony pelvis.

Degenerative disc disease L3-4.

## 2019-06-21 DIAGNOSIS — R05 Cough: Secondary | ICD-10-CM | POA: Diagnosis not present

## 2019-07-01 DIAGNOSIS — R413 Other amnesia: Secondary | ICD-10-CM | POA: Diagnosis not present

## 2019-07-01 DIAGNOSIS — G43109 Migraine with aura, not intractable, without status migrainosus: Secondary | ICD-10-CM | POA: Diagnosis not present

## 2019-07-01 DIAGNOSIS — H9313 Tinnitus, bilateral: Secondary | ICD-10-CM | POA: Diagnosis not present

## 2019-07-05 DIAGNOSIS — M461 Sacroiliitis, not elsewhere classified: Secondary | ICD-10-CM | POA: Diagnosis not present

## 2019-07-05 DIAGNOSIS — M5416 Radiculopathy, lumbar region: Secondary | ICD-10-CM | POA: Diagnosis not present

## 2019-07-05 DIAGNOSIS — M533 Sacrococcygeal disorders, not elsewhere classified: Secondary | ICD-10-CM | POA: Diagnosis not present

## 2019-07-13 ENCOUNTER — Encounter: Payer: Self-pay | Admitting: *Deleted

## 2019-07-19 DIAGNOSIS — H6123 Impacted cerumen, bilateral: Secondary | ICD-10-CM | POA: Diagnosis not present

## 2019-07-19 DIAGNOSIS — H9313 Tinnitus, bilateral: Secondary | ICD-10-CM | POA: Diagnosis not present

## 2019-08-09 DIAGNOSIS — G43109 Migraine with aura, not intractable, without status migrainosus: Secondary | ICD-10-CM | POA: Diagnosis not present

## 2019-08-16 DIAGNOSIS — R419 Unspecified symptoms and signs involving cognitive functions and awareness: Secondary | ICD-10-CM | POA: Diagnosis not present

## 2019-09-21 DIAGNOSIS — M461 Sacroiliitis, not elsewhere classified: Secondary | ICD-10-CM | POA: Diagnosis not present

## 2019-09-21 DIAGNOSIS — M533 Sacrococcygeal disorders, not elsewhere classified: Secondary | ICD-10-CM | POA: Diagnosis not present

## 2019-09-21 DIAGNOSIS — M47816 Spondylosis without myelopathy or radiculopathy, lumbar region: Secondary | ICD-10-CM | POA: Diagnosis not present

## 2019-09-21 DIAGNOSIS — M5136 Other intervertebral disc degeneration, lumbar region: Secondary | ICD-10-CM | POA: Diagnosis not present

## 2019-10-27 ENCOUNTER — Other Ambulatory Visit: Payer: Self-pay

## 2019-10-29 ENCOUNTER — Telehealth: Payer: Self-pay | Admitting: *Deleted

## 2019-10-29 ENCOUNTER — Ambulatory Visit: Payer: Federal, State, Local not specified - PPO | Admitting: Obstetrics & Gynecology

## 2019-10-29 ENCOUNTER — Other Ambulatory Visit (HOSPITAL_COMMUNITY)
Admission: RE | Admit: 2019-10-29 | Discharge: 2019-10-29 | Disposition: A | Discharge status: Home / Self Care | Payer: Federal, State, Local not specified - PPO | Source: Ambulatory Visit | Attending: Obstetrics & Gynecology | Admitting: Obstetrics & Gynecology

## 2019-10-29 ENCOUNTER — Encounter: Payer: Self-pay | Admitting: Obstetrics & Gynecology

## 2019-10-29 ENCOUNTER — Other Ambulatory Visit: Payer: Self-pay

## 2019-10-29 VITALS — BP 150/72 | HR 68 | Temp 97.7°F | Resp 12 | Ht 63.75 in | Wt 131.0 lb

## 2019-10-29 DIAGNOSIS — N941 Unspecified dyspareunia: Secondary | ICD-10-CM

## 2019-10-29 DIAGNOSIS — Z124 Encounter for screening for malignant neoplasm of cervix: Secondary | ICD-10-CM | POA: Insufficient documentation

## 2019-10-29 DIAGNOSIS — Z0000 Encounter for general adult medical examination without abnormal findings: Secondary | ICD-10-CM | POA: Diagnosis not present

## 2019-10-29 DIAGNOSIS — Z01419 Encounter for gynecological examination (general) (routine) without abnormal findings: Secondary | ICD-10-CM

## 2019-10-29 MED ORDER — PREMARIN 0.625 MG/GM VA CREA: TOPICAL_CREAM | VAGINAL | 1 refills | Status: AC

## 2019-10-29 NOTE — Patient Instructions (Signed)
[facility] [address] [place],  [place]  [address]  Main: [phone]  Dr. Patsy Lager, Dr. Zola Button, Dr. Rogelia Rohrer, Sandford Craze

## 2019-10-29 NOTE — Telephone Encounter (Signed)
-----   Message from Jerene Bears, MD sent at 10/29/2019 10:40 AM EST ----- Regarding: mmg Pt needs MMG at [facility] in [facility].  Could you schedule this.  I can't remember if we need to do paperwork for this location.  Any day or time works.  Thanks.  Rosalita Chessman

## 2019-10-29 NOTE — Telephone Encounter (Signed)
Spoke with Leotis Shames at [facility] [phone]. Patient scheduled for screening MMG on 11/09/19 at 3pm. No order needed.    Call placed to patient. Patient is driving, request return call to mobile number to leave appt details. Patient is agreeable to date and time.   Call returned to patients mobile number, left detailed message advised of appt date and time and contact information. Return call to office if any additional questions.   Routing to provider for final review. Patient is agreeable to disposition. Will close encounter.

## 2019-10-29 NOTE — Progress Notes (Signed)
64 y.o. Claudia Bowen Married White or Caucasian female here as a new patient for an annual exam.  PMP for 6 years.  She used a progestsert in the past.  Denies vaginal bleeding.  H/o painful intercourse for years.    H/o migraines.  Followed by Dr. Jannifer Franklin at [place].  She has seen several different providers over the years.  Has undergone 5 neck and low back surgeries over the years.    Retired from ICU nursing worked.  Husband is a retired Statistician and worked in the [place] system.    H/o dyspareunia with insertion that has been present for years.  Has significant pain with insertion.  Hard to be intimate with spouse.  Has never been prescribed anything for this.  Has seen two previous providers who both told her she just has to be SA more often.  Has not used vaginal creams.  Also reports a mid abdominal pain with orgasm that she must put her hand on her side and apply pressure to relieve.    She reports pregnancies were complicated by placental condition that decreased blood flow.  Pt does not recall name and no condition I discussed with pt sounds right to her.  Pt wrote me a letter prior to OV today to relay some of her concerns about her visit today and prior experiences.  Pt aware I did receive letter and we addressed all of her concerns in the letter today.  No LMP recorded. Patient is postmenopausal.          Sexually active: No.  The current method of family planning is post menopausal status.    Exercising: Yes.    arm and leg lifts Smoker:  no  Health Maintenance: Pap:  12/23/16 Neg:Neg HR HPV History of abnormal Pap:  no MMG:  4/18.  Reviewed in Epic Colonoscopy: 2 years ago per pt, normal per pt.  Dr. Watt Climes at [facility].   BMD: 2018, negative per pt. TDaP: 2012 Pneumonia vaccine(s):  None.  Has been recommended to start the pneumonia vaccinations but has declined at this time.   Shingrix: completed Influenza:  06/2019 Hep C testing: yes, pt has card Screening Labs: can do here  today   reports that she has never smoked. She has never used smokeless tobacco. She reports that she does not drink alcohol or use drugs.  Past Medical History:  Diagnosis Date  . Chronic back pain    degenerative disc  . Complication of anesthesia    pt has a neurogenic bladder and states that she has to go home with a foley catheter  . Constipation   . DDD (degenerative disc disease), cervical    has cervical & lumbar  . Endometriosis   . GERD (gastroesophageal reflux disease)    takes Protonix daily  . Headache(784.0)   . Hemorrhoids    has never had hemorrhoids  . History of kidney stones    last flare up was 2012  . Hypertension   . IBS (irritable bowel syndrome)   . Migraine    last migraine was 09/15/2017  . Neck pain    buldging disc  . Neurogenic bladder   . Oligouria    has never had per the patient  . Pneumonia    at age 66  . Raynaud's disease     Past Surgical History:  Procedure Laterality Date  . ANTERIOR CERVICAL DECOMP/DISCECTOMY FUSION  09/27/2011   Procedure: ANTERIOR CERVICAL DECOMPRESSION/DISCECTOMY FUSION 3 LEVELS;  Surgeon: Julien Girt  Wynetta Emery;  Location: [facility];  Service: Neurosurgery;  Laterality: N/A;  Cervical Three-Four Cervical Four-Five, Cervical Six-Seven Anterior Cervical Decompression Fusion with exploration of fusion Cervical Five-Six- Removal of Hardware (Atlantis Plate) cadaver bone/Atlantis Translational Rm # [id number] to follow  . BACK SURGERY  2006  . CESAREAN SECTION  1987/1992  . DILATION AND CURETTAGE OF UTERUS     at age 43/24/31  . HERNIA REPAIR     at age 77  . LUMBAR FUSION  09/29/2017   POST  . NECK SURGERY  2006  . pilondial cyst     at age 21 x 2  . renal stents  2005/2007  . TONSILLECTOMY     and adenoidectomy as a child  . TUBAL LIGATION      Current Outpatient Medications  Medication Sig Dispense Refill  . atenolol (TENORMIN) 25 MG tablet Take 1.5 tablets by mouth daily.    . celecoxib (CELEBREX) 200 MG capsule  Take by mouth as needed.    . Cholecalciferol (VITAMIN D3) 25 MCG (1000 UT) CAPS Take by mouth.    Marland Kitchen Galcanezumab-gnlm (EMGALITY) 120 MG/ML SOAJ Inject into the skin.    Marland Kitchen lamoTRIgine (LAMICTAL) 25 MG tablet Take by mouth.    Marland Kitchen omeprazole (PRILOSEC) 20 MG capsule Take 20 mg by mouth daily.    . ondansetron (ZOFRAN-ODT) 4 MG disintegrating tablet Take 4 mg by mouth every 8 (eight) hours as needed for nausea or vomiting.   3  . SUMAtriptan (IMITREX) 50 MG tablet Take 25 mg by mouth every 2 (two) hours as needed for migraine. May repeat in 2 hours if headache persists or recurs.    Marland Kitchen tiZANidine (ZANAFLEX) 4 MG tablet Take by mouth.    . Ubrogepant (UBRELVY) 100 MG TABS TAKE 100 MG BY MOUTH AS NEEDED.    Marland Kitchen pantoprazole (PROTONIX) 40 MG tablet Take 40 mg by mouth daily.       No current facility-administered medications for this visit.    Family History  Problem Relation Age of Onset  . Breast cancer Mother   . Renal Disease Father   . Heart attack Father   . Von Willebrand disease Father   . Endometriosis Sister   . Von Willebrand disease Brother   . Von Willebrand disease Son   . Anesthesia problems Neg Hx   . Hypotension Neg Hx   . Malignant hyperthermia Neg Hx   . Pseudochol deficiency Neg Hx     Review of Systems  Genitourinary: Positive for vaginal pain.       Pain with intercourse, having left pain on abd,   All other systems reviewed and are negative.   Exam:   BP (!) 150/72 (BP Location: Right Arm, Patient Position: Sitting, Cuff Size: Normal)   Pulse 68   Temp 97.7 F (36.5 C) (Temporal)   Resp 12   Ht 5' 3.75" (1.619 m)   Wt 131 lb (59.4 kg)   BMI 22.66 kg/m   Height: 5' 3.75" (161.9 cm)  Ht Readings from Last 3 Encounters:  10/29/19 5' 3.75" (1.619 m)  09/18/17 5\' 4"  (1.626 m)  01/30/17 5\' 4"  (1.626 m)    General appearance: alert, cooperative and appears stated age Head: Normocephalic, without obvious abnormality, atraumatic Neck: no adenopathy, supple,  symmetrical, trachea midline and thyroid normal to inspection and palpation Lungs: clear to auscultation bilaterally Breasts: normal appearance, no masses or tenderness Heart: regular rate and rhythm Abdomen: soft, non-tender; bowel sounds normal; no masses,  no  organomegaly Extremities: extremities normal, atraumatic, no cyanosis or edema Skin: Skin color, texture, turgor normal. No rashes or lesions Lymph nodes: Cervical, supraclavicular, and axillary nodes normal. No abnormal inguinal nodes palpated Neurologic: Grossly normal   Pelvic: External genitalia:  no lesions              Urethra:  normal appearing urethra with no masses, tenderness or lesions              Bartholins and Skenes: normal                 Vagina: normal appearing vagina with normal color and discharge, no lesions              Cervix: no lesions              Pap taken: Yes.   Bimanual Exam:  Uterus:  normal size, contour, position, consistency, mobility, non-tender              Adnexa: normal adnexa and no mass, fullness, tenderness               Rectovaginal: Confirms               Anus:  normal sphincter tone, no lesions  Chaperone, Zenovia Jordan, CMA, was present for exam.  A:  Well Woman with normal exam PMP, no HRT Dyspareunia Vaginal atrophic changes Hypertension Migraines H/o migraines Very narrow pelvic arch which may be contributing to dysparuunia  P:   Mammogram guidelines reviewed.  Will scheduled for her at [facility] as I think we need to schedule the appt Tdap due next year pap smear with HR HPV obtained today Will start vaginal estrogen cream, Premarin, 1/2 gram pv twice weekly and recheck 3 months.  Discussed with pt value of pelvic PT as well as vaginal estrogen cream.  I think we can get some improvement for her but I am not sure that I will fully resolve the problem.  Pt pleased with at least something to try. CMP, TSH, Vit D, Lipids, and CBC obtained today Will also plan AEX 1  year

## 2019-10-30 LAB — COMPREHENSIVE METABOLIC PANEL
ALT: 13 IU/L (ref 0–32)
AST: 16 IU/L (ref 0–40)
Albumin/Globulin Ratio: 1.6 (ref 1.2–2.2)
Albumin: 4.4 g/dL (ref 3.8–4.8)
Alkaline Phosphatase: 62 IU/L (ref 39–117)
BUN/Creatinine Ratio: 21 (ref 12–28)
BUN: 19 mg/dL (ref 8–27)
Bilirubin Total: 0.3 mg/dL (ref 0.0–1.2)
CO2: 26 mmol/L (ref 20–29)
Calcium: 10 mg/dL (ref 8.7–10.3)
Chloride: 101 mmol/L (ref 96–106)
Creatinine, Ser: 0.91 mg/dL (ref 0.57–1.00)
GFR calc Af Amer: 77 mL/min/{1.73_m2} (ref >59)
GFR calc non Af Amer: 67 mL/min/{1.73_m2} (ref >59)
Globulin, Total: 2.7 g/dL (ref 1.5–4.5)
Glucose: 100 mg/dL — ABNORMAL HIGH (ref 65–99)
Potassium: 4.5 mmol/L (ref 3.5–5.2)
Sodium: 139 mmol/L (ref 134–144)
Total Protein: 7.1 g/dL (ref 6.0–8.5)

## 2019-10-30 LAB — LIPID PANEL
Chol/HDL Ratio: 2.9 ratio (ref 0.0–4.4)
Cholesterol, Total: 297 mg/dL — ABNORMAL HIGH (ref 100–199)
HDL: 103 mg/dL (ref >39)
LDL Chol Calc (NIH): 175 mg/dL — ABNORMAL HIGH (ref 0–99)
Triglycerides: 116 mg/dL (ref 0–149)
VLDL Cholesterol Cal: 19 mg/dL (ref 5–40)

## 2019-10-30 LAB — CBC
Hematocrit: 45.6 % (ref 34.0–46.6)
Hemoglobin: 15.1 g/dL (ref 11.1–15.9)
MCH: 29.1 pg (ref 26.6–33.0)
MCHC: 33.1 g/dL (ref 31.5–35.7)
MCV: 88 fL (ref 79–97)
Platelets: 261 10*3/uL (ref 150–450)
RBC: 5.19 x10E6/uL (ref 3.77–5.28)
RDW: 13 % (ref 11.7–15.4)
WBC: 7.9 10*3/uL (ref 3.4–10.8)

## 2019-10-30 LAB — THYROID PEROXIDASE ANTIBODY: Thyroperoxidase Ab SerPl-aCnc: 18 IU/mL (ref 0–34)

## 2019-10-30 LAB — TSH: TSH: 1.97 u[IU]/mL (ref 0.450–4.500)

## 2019-11-03 LAB — CYTOLOGY - PAP
Comment: NEGATIVE
Diagnosis: NEGATIVE
High risk HPV: NEGATIVE

## 2019-11-09 ENCOUNTER — Encounter: Payer: Self-pay | Admitting: Obstetrics & Gynecology

## 2020-01-27 ENCOUNTER — Ambulatory Visit: Payer: Federal, State, Local not specified - PPO | Admitting: Obstetrics & Gynecology
